# Patient Record
Sex: Female | Born: 1985 | Race: White | Hispanic: No | Marital: Married | Smoking: Never smoker
Health system: Southern US, Community
[De-identification: ages and names within clinical notes are randomized; demographics above are authoritative.]

## PROBLEM LIST (undated history)

## (undated) ENCOUNTER — Inpatient Hospital Stay (HOSPITAL_COMMUNITY): Payer: Self-pay

## (undated) DIAGNOSIS — B009 Herpesviral infection, unspecified: Secondary | ICD-10-CM

## (undated) DIAGNOSIS — H3523 Other non-diabetic proliferative retinopathy, bilateral: Secondary | ICD-10-CM

## (undated) DIAGNOSIS — R87619 Unspecified abnormal cytological findings in specimens from cervix uteri: Secondary | ICD-10-CM

## (undated) HISTORY — DX: Unspecified abnormal cytological findings in specimens from cervix uteri: R87.619

## (undated) HISTORY — DX: Herpesviral infection, unspecified: B00.9

## (undated) HISTORY — PX: LYMPH NODE BIOPSY: SHX201

---
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
## 2010-09-18 ENCOUNTER — Ambulatory Visit (HOSPITAL_COMMUNITY): Admission: RE | Admit: 2010-09-18 | Discharge: 2010-09-18 | Payer: Self-pay | Admitting: Obstetrics & Gynecology

## 2010-09-18 ENCOUNTER — Ambulatory Visit: Payer: Self-pay | Admitting: Obstetrics & Gynecology

## 2010-10-07 ENCOUNTER — Ambulatory Visit: Payer: Self-pay | Admitting: Family Medicine

## 2010-10-21 ENCOUNTER — Encounter: Payer: Self-pay | Admitting: Advanced Practice Midwife

## 2010-10-21 ENCOUNTER — Ambulatory Visit: Payer: Self-pay | Admitting: Obstetrics and Gynecology

## 2010-10-21 LAB — CONVERTED CEMR LAB
ALT: 9 units/L (ref 0–35)
AST: 13 units/L (ref 0–37)
Albumin: 4.1 g/dL (ref 3.5–5.2)
Alkaline Phosphatase: 38 units/L — ABNORMAL LOW (ref 39–117)
BUN: 8 mg/dL (ref 6–23)
CO2: 25 meq/L (ref 19–32)
Calcium: 9.2 mg/dL (ref 8.4–10.5)
Chloride: 105 meq/L (ref 96–112)
Creatinine, Ser: 0.6 mg/dL (ref 0.40–1.20)
Glucose, Bld: 162 mg/dL — ABNORMAL HIGH (ref 70–99)
HCT: 36.3 % (ref 36.0–46.0)
Hemoglobin: 12.2 g/dL (ref 12.0–15.0)
MCHC: 33.6 g/dL (ref 30.0–36.0)
MCV: 90.8 fL (ref 78.0–100.0)
Platelets: 182 10*3/uL (ref 150–400)
Potassium: 4 meq/L (ref 3.5–5.3)
RBC: 4 M/uL (ref 3.87–5.11)
RDW: 13 % (ref 11.5–15.5)
Sodium: 138 meq/L (ref 135–145)
TSH: 1.878 microintl units/mL (ref 0.350–4.500)
Total Bilirubin: 0.4 mg/dL (ref 0.3–1.2)
Total Protein: 6.4 g/dL (ref 6.0–8.3)
Uric Acid, Serum: 2.6 mg/dL (ref 2.4–7.0)
WBC: 7.8 10*3/uL (ref 4.0–10.5)

## 2010-10-22 ENCOUNTER — Encounter (INDEPENDENT_AMBULATORY_CARE_PROVIDER_SITE_OTHER): Payer: Self-pay | Admitting: *Deleted

## 2010-10-22 LAB — CONVERTED CEMR LAB
Collection Interval-CRCL: 24 hr
Creatinine 24 HR UR: 1339 mg/24hr (ref 700–1800)
Creatinine Clearance: 155 mL/min — ABNORMAL HIGH (ref 75–115)
Creatinine, Urine: 74.4 mg/dL
Protein, Ur: 54 mg/24hr (ref 50–100)

## 2010-11-04 ENCOUNTER — Encounter
Admission: RE | Admit: 2010-11-04 | Discharge: 2011-01-07 | Payer: Self-pay | Source: Home / Self Care | Attending: Obstetrics & Gynecology | Admitting: Obstetrics & Gynecology

## 2010-11-04 ENCOUNTER — Ambulatory Visit: Payer: Self-pay | Admitting: Obstetrics & Gynecology

## 2010-11-18 ENCOUNTER — Ambulatory Visit: Payer: Self-pay | Admitting: Obstetrics & Gynecology

## 2010-11-25 ENCOUNTER — Ambulatory Visit (HOSPITAL_COMMUNITY)
Admission: RE | Admit: 2010-11-25 | Discharge: 2010-11-25 | Payer: Self-pay | Source: Home / Self Care | Attending: Obstetrics and Gynecology | Admitting: Obstetrics and Gynecology

## 2010-12-09 ENCOUNTER — Ambulatory Visit: Payer: Self-pay | Admitting: Obstetrics & Gynecology

## 2010-12-10 ENCOUNTER — Encounter: Payer: Self-pay | Admitting: Obstetrics & Gynecology

## 2010-12-23 ENCOUNTER — Ambulatory Visit
Admission: RE | Admit: 2010-12-23 | Discharge: 2010-12-23 | Payer: Self-pay | Source: Home / Self Care | Attending: Obstetrics and Gynecology | Admitting: Obstetrics and Gynecology

## 2010-12-25 LAB — POCT URINALYSIS DIPSTICK
Bilirubin Urine: NEGATIVE
Hgb urine dipstick: NEGATIVE
Ketones, ur: NEGATIVE mg/dL
Nitrite: NEGATIVE
Protein, ur: 30 mg/dL — AB
Specific Gravity, Urine: 1.02 (ref 1.005–1.030)
Urine Glucose, Fasting: NEGATIVE mg/dL
Urobilinogen, UA: 0.2 mg/dL (ref 0.0–1.0)
pH: 7 (ref 5.0–8.0)

## 2010-12-28 ENCOUNTER — Encounter: Payer: Self-pay | Admitting: Obstetrics & Gynecology

## 2011-01-06 ENCOUNTER — Ambulatory Visit (HOSPITAL_COMMUNITY)
Admission: RE | Admit: 2011-01-06 | Discharge: 2011-01-06 | Payer: Self-pay | Source: Home / Self Care | Attending: Family Medicine | Admitting: Family Medicine

## 2011-01-06 ENCOUNTER — Other Ambulatory Visit: Payer: Self-pay | Admitting: Family Medicine

## 2011-01-06 ENCOUNTER — Ambulatory Visit
Admission: RE | Admit: 2011-01-06 | Discharge: 2011-01-06 | Payer: Self-pay | Source: Home / Self Care | Attending: Obstetrics & Gynecology | Admitting: Obstetrics & Gynecology

## 2011-01-06 DIAGNOSIS — O24419 Gestational diabetes mellitus in pregnancy, unspecified control: Secondary | ICD-10-CM

## 2011-01-06 LAB — POCT URINALYSIS DIPSTICK
Bilirubin Urine: NEGATIVE
Hgb urine dipstick: NEGATIVE
Ketones, ur: NEGATIVE mg/dL
Protein, ur: NEGATIVE mg/dL
Specific Gravity, Urine: 1.02 (ref 1.005–1.030)
Urine Glucose, Fasting: NEGATIVE mg/dL
Urobilinogen, UA: 0.2 mg/dL (ref 0.0–1.0)
pH: 7 (ref 5.0–8.0)

## 2011-01-20 ENCOUNTER — Other Ambulatory Visit: Payer: Self-pay

## 2011-01-20 DIAGNOSIS — O24919 Unspecified diabetes mellitus in pregnancy, unspecified trimester: Secondary | ICD-10-CM

## 2011-01-20 LAB — POCT URINALYSIS DIPSTICK
Bilirubin Urine: NEGATIVE
Hgb urine dipstick: NEGATIVE
Ketones, ur: NEGATIVE mg/dL
Nitrite: NEGATIVE
Protein, ur: NEGATIVE mg/dL
Specific Gravity, Urine: 1.015 (ref 1.005–1.030)
Urine Glucose, Fasting: 500 mg/dL — AB
Urobilinogen, UA: 0.2 mg/dL (ref 0.0–1.0)
pH: 6 (ref 5.0–8.0)

## 2011-01-27 ENCOUNTER — Ambulatory Visit (HOSPITAL_COMMUNITY)
Admission: RE | Admit: 2011-01-27 | Discharge: 2011-01-27 | Disposition: A | Discharge status: Home / Self Care | Payer: PRIVATE HEALTH INSURANCE | Source: Ambulatory Visit | Attending: Family Medicine | Admitting: Family Medicine

## 2011-01-27 DIAGNOSIS — O24919 Unspecified diabetes mellitus in pregnancy, unspecified trimester: Secondary | ICD-10-CM | POA: Insufficient documentation

## 2011-01-27 DIAGNOSIS — O24419 Gestational diabetes mellitus in pregnancy, unspecified control: Secondary | ICD-10-CM

## 2011-01-27 DIAGNOSIS — Z3689 Encounter for other specified antenatal screening: Secondary | ICD-10-CM | POA: Insufficient documentation

## 2011-02-03 ENCOUNTER — Other Ambulatory Visit: Payer: Self-pay

## 2011-02-03 ENCOUNTER — Encounter: Payer: PRIVATE HEALTH INSURANCE | Attending: Obstetrics & Gynecology | Admitting: Dietician

## 2011-02-03 ENCOUNTER — Other Ambulatory Visit: Payer: Self-pay | Admitting: Obstetrics & Gynecology

## 2011-02-03 ENCOUNTER — Encounter (INDEPENDENT_AMBULATORY_CARE_PROVIDER_SITE_OTHER): Payer: Self-pay | Admitting: *Deleted

## 2011-02-03 DIAGNOSIS — O24919 Unspecified diabetes mellitus in pregnancy, unspecified trimester: Secondary | ICD-10-CM

## 2011-02-03 DIAGNOSIS — E119 Type 2 diabetes mellitus without complications: Secondary | ICD-10-CM | POA: Insufficient documentation

## 2011-02-03 DIAGNOSIS — Z713 Dietary counseling and surveillance: Secondary | ICD-10-CM | POA: Insufficient documentation

## 2011-02-03 LAB — POCT URINALYSIS DIPSTICK
Bilirubin Urine: NEGATIVE
Hgb urine dipstick: NEGATIVE
Ketones, ur: NEGATIVE mg/dL
Nitrite: NEGATIVE
Protein, ur: NEGATIVE mg/dL
Specific Gravity, Urine: 1.02 (ref 1.005–1.030)
Urine Glucose, Fasting: 250 mg/dL — AB
Urobilinogen, UA: 0.2 mg/dL (ref 0.0–1.0)
pH: 8.5 — ABNORMAL HIGH (ref 5.0–8.0)

## 2011-02-03 LAB — CONVERTED CEMR LAB

## 2011-02-04 ENCOUNTER — Encounter: Payer: Self-pay | Admitting: Advanced Practice Midwife

## 2011-02-04 ENCOUNTER — Encounter (INDEPENDENT_AMBULATORY_CARE_PROVIDER_SITE_OTHER): Payer: Self-pay | Admitting: *Deleted

## 2011-02-04 LAB — CONVERTED CEMR LAB
Collection Interval-CRCL: 24 hr
Creatinine 24 HR UR: 1317 mg/24hr (ref 700–1800)
Creatinine Clearance: 176 mL/min — ABNORMAL HIGH (ref 75–115)
Creatinine, Urine: 50.7 mg/dL
Fructosamine: 213 micromoles/L (ref <285)
Protein, 24H Urine: 78 mg/24hr (ref 50–100)

## 2011-02-05 LAB — CONVERTED CEMR LAB: Pap Smear: UNDETERMINED

## 2011-02-10 ENCOUNTER — Other Ambulatory Visit: Payer: Self-pay | Admitting: Obstetrics & Gynecology

## 2011-02-10 ENCOUNTER — Other Ambulatory Visit: Payer: Self-pay

## 2011-02-10 DIAGNOSIS — R87610 Atypical squamous cells of undetermined significance on cytologic smear of cervix (ASC-US): Secondary | ICD-10-CM

## 2011-02-10 DIAGNOSIS — O24919 Unspecified diabetes mellitus in pregnancy, unspecified trimester: Secondary | ICD-10-CM

## 2011-02-10 LAB — POCT URINALYSIS DIPSTICK
Bilirubin Urine: NEGATIVE
Glucose, UA: 100 mg/dL — AB
Hgb urine dipstick: NEGATIVE
Ketones, ur: NEGATIVE mg/dL
Nitrite: NEGATIVE
Protein, ur: NEGATIVE mg/dL
Specific Gravity, Urine: 1.02 (ref 1.005–1.030)
Urobilinogen, UA: 0.2 mg/dL (ref 0.0–1.0)
pH: 7 (ref 5.0–8.0)

## 2011-02-12 ENCOUNTER — Encounter (HOSPITAL_COMMUNITY): Payer: Self-pay

## 2011-02-12 ENCOUNTER — Ambulatory Visit (HOSPITAL_COMMUNITY)
Admission: RE | Admit: 2011-02-12 | Discharge: 2011-02-12 | Disposition: A | Discharge status: Home / Self Care | Payer: PRIVATE HEALTH INSURANCE | Source: Ambulatory Visit | Attending: Obstetrics & Gynecology | Admitting: Obstetrics & Gynecology

## 2011-02-12 DIAGNOSIS — Z3689 Encounter for other specified antenatal screening: Secondary | ICD-10-CM | POA: Insufficient documentation

## 2011-02-12 DIAGNOSIS — O24919 Unspecified diabetes mellitus in pregnancy, unspecified trimester: Secondary | ICD-10-CM | POA: Insufficient documentation

## 2011-02-17 ENCOUNTER — Other Ambulatory Visit: Payer: Self-pay

## 2011-02-17 DIAGNOSIS — O24919 Unspecified diabetes mellitus in pregnancy, unspecified trimester: Secondary | ICD-10-CM

## 2011-02-17 LAB — POCT URINALYSIS DIPSTICK
Bilirubin Urine: NEGATIVE
Bilirubin Urine: NEGATIVE
Glucose, UA: NEGATIVE mg/dL
Glucose, UA: NEGATIVE mg/dL
Hgb urine dipstick: NEGATIVE
Hgb urine dipstick: NEGATIVE
Ketones, ur: NEGATIVE mg/dL
Ketones, ur: NEGATIVE mg/dL
Nitrite: NEGATIVE
Nitrite: NEGATIVE
Protein, ur: NEGATIVE mg/dL
Protein, ur: NEGATIVE mg/dL
Specific Gravity, Urine: 1.025 (ref 1.005–1.030)
Specific Gravity, Urine: 1.015 (ref 1.005–1.030)
Urobilinogen, UA: 0.2 mg/dL (ref 0.0–1.0)
Urobilinogen, UA: 0.2 mg/dL (ref 0.0–1.0)
pH: 6.5 (ref 5.0–8.0)
pH: 8.5 — ABNORMAL HIGH (ref 5.0–8.0)

## 2011-02-18 LAB — POCT URINALYSIS DIPSTICK
Bilirubin Urine: NEGATIVE
Bilirubin Urine: NEGATIVE
Bilirubin Urine: NEGATIVE
Glucose, UA: NEGATIVE mg/dL
Glucose, UA: 100 mg/dL — AB
Glucose, UA: NEGATIVE mg/dL
Hgb urine dipstick: NEGATIVE
Hgb urine dipstick: NEGATIVE
Hgb urine dipstick: NEGATIVE
Ketones, ur: NEGATIVE mg/dL
Ketones, ur: NEGATIVE mg/dL
Ketones, ur: NEGATIVE mg/dL
Nitrite: NEGATIVE
Nitrite: NEGATIVE
Nitrite: NEGATIVE
Protein, ur: NEGATIVE mg/dL
Protein, ur: NEGATIVE mg/dL
Protein, ur: NEGATIVE mg/dL
Specific Gravity, Urine: 1.02 (ref 1.005–1.030)
Specific Gravity, Urine: 1.025 (ref 1.005–1.030)
Specific Gravity, Urine: >=1.03 (ref 1.005–1.030)
Urobilinogen, UA: 0.2 mg/dL (ref 0.0–1.0)
Urobilinogen, UA: 0.2 mg/dL (ref 0.0–1.0)
Urobilinogen, UA: 0.2 mg/dL (ref 0.0–1.0)
pH: 7 (ref 5.0–8.0)
pH: 5.5 (ref 5.0–8.0)
pH: 6 (ref 5.0–8.0)

## 2011-02-19 ENCOUNTER — Encounter (HOSPITAL_COMMUNITY): Payer: Self-pay

## 2011-02-19 ENCOUNTER — Ambulatory Visit (HOSPITAL_COMMUNITY)
Admission: RE | Admit: 2011-02-19 | Discharge: 2011-02-19 | Disposition: A | Discharge status: Home / Self Care | Payer: PRIVATE HEALTH INSURANCE | Source: Ambulatory Visit | Attending: Obstetrics & Gynecology | Admitting: Obstetrics & Gynecology

## 2011-02-19 DIAGNOSIS — Z3689 Encounter for other specified antenatal screening: Secondary | ICD-10-CM | POA: Insufficient documentation

## 2011-02-19 DIAGNOSIS — O24919 Unspecified diabetes mellitus in pregnancy, unspecified trimester: Secondary | ICD-10-CM | POA: Insufficient documentation

## 2011-02-19 LAB — POCT URINALYSIS DIPSTICK
Bilirubin Urine: NEGATIVE
Glucose, UA: NEGATIVE mg/dL
Hgb urine dipstick: NEGATIVE
Ketones, ur: NEGATIVE mg/dL
Nitrite: NEGATIVE
Protein, ur: NEGATIVE mg/dL
Specific Gravity, Urine: 1.02 (ref 1.005–1.030)
Urobilinogen, UA: 0.2 mg/dL (ref 0.0–1.0)
pH: 8.5 — ABNORMAL HIGH (ref 5.0–8.0)

## 2011-02-20 LAB — POCT PREGNANCY, URINE: Preg Test, Ur: POSITIVE

## 2011-02-24 ENCOUNTER — Other Ambulatory Visit: Payer: Self-pay

## 2011-02-24 ENCOUNTER — Ambulatory Visit (HOSPITAL_COMMUNITY)
Admission: RE | Admit: 2011-02-24 | Discharge: 2011-02-24 | Disposition: A | Discharge status: Home / Self Care | Payer: PRIVATE HEALTH INSURANCE | Source: Ambulatory Visit | Attending: Obstetrics & Gynecology | Admitting: Obstetrics & Gynecology

## 2011-02-24 ENCOUNTER — Other Ambulatory Visit: Payer: Self-pay | Admitting: Obstetrics & Gynecology

## 2011-02-24 DIAGNOSIS — Z3689 Encounter for other specified antenatal screening: Secondary | ICD-10-CM | POA: Insufficient documentation

## 2011-02-24 DIAGNOSIS — O24919 Unspecified diabetes mellitus in pregnancy, unspecified trimester: Secondary | ICD-10-CM

## 2011-02-24 DIAGNOSIS — R87610 Atypical squamous cells of undetermined significance on cytologic smear of cervix (ASC-US): Secondary | ICD-10-CM

## 2011-02-24 LAB — POCT URINALYSIS DIP (DEVICE)
Bilirubin Urine: NEGATIVE
Glucose, UA: NEGATIVE mg/dL
Hgb urine dipstick: NEGATIVE
Ketones, ur: NEGATIVE mg/dL
Nitrite: NEGATIVE
Protein, ur: NEGATIVE mg/dL
Specific Gravity, Urine: 1.015 (ref 1.005–1.030)
Urobilinogen, UA: 0.2 mg/dL (ref 0.0–1.0)
pH: 8.5 — ABNORMAL HIGH (ref 5.0–8.0)

## 2011-03-03 ENCOUNTER — Encounter: Payer: PRIVATE HEALTH INSURANCE | Attending: Obstetrics and Gynecology | Admitting: Dietician

## 2011-03-03 ENCOUNTER — Other Ambulatory Visit: Payer: Self-pay | Admitting: Obstetrics and Gynecology

## 2011-03-03 DIAGNOSIS — R87610 Atypical squamous cells of undetermined significance on cytologic smear of cervix (ASC-US): Secondary | ICD-10-CM

## 2011-03-03 DIAGNOSIS — O99810 Abnormal glucose complicating pregnancy: Secondary | ICD-10-CM | POA: Insufficient documentation

## 2011-03-03 DIAGNOSIS — O24919 Unspecified diabetes mellitus in pregnancy, unspecified trimester: Secondary | ICD-10-CM

## 2011-03-03 DIAGNOSIS — Z713 Dietary counseling and surveillance: Secondary | ICD-10-CM | POA: Insufficient documentation

## 2011-03-03 DIAGNOSIS — Z331 Pregnant state, incidental: Secondary | ICD-10-CM

## 2011-03-03 LAB — POCT URINALYSIS DIP (DEVICE)
Bilirubin Urine: NEGATIVE
Glucose, UA: NEGATIVE mg/dL
Hgb urine dipstick: NEGATIVE
Ketones, ur: NEGATIVE mg/dL
Nitrite: NEGATIVE
Protein, ur: NEGATIVE mg/dL
Specific Gravity, Urine: 1.015 (ref 1.005–1.030)
Urobilinogen, UA: 0.2 mg/dL (ref 0.0–1.0)
pH: 7 (ref 5.0–8.0)

## 2011-03-06 ENCOUNTER — Other Ambulatory Visit: Payer: PRIVATE HEALTH INSURANCE

## 2011-03-06 DIAGNOSIS — O99810 Abnormal glucose complicating pregnancy: Secondary | ICD-10-CM

## 2011-03-10 ENCOUNTER — Other Ambulatory Visit: Payer: PRIVATE HEALTH INSURANCE

## 2011-03-10 ENCOUNTER — Other Ambulatory Visit: Payer: Self-pay | Admitting: Obstetrics & Gynecology

## 2011-03-10 DIAGNOSIS — O24919 Unspecified diabetes mellitus in pregnancy, unspecified trimester: Secondary | ICD-10-CM

## 2011-03-10 DIAGNOSIS — Z331 Pregnant state, incidental: Secondary | ICD-10-CM

## 2011-03-10 DIAGNOSIS — R87610 Atypical squamous cells of undetermined significance on cytologic smear of cervix (ASC-US): Secondary | ICD-10-CM

## 2011-03-10 LAB — POCT URINALYSIS DIP (DEVICE)
Bilirubin Urine: NEGATIVE
Glucose, UA: 100 mg/dL — AB
Hgb urine dipstick: NEGATIVE
Ketones, ur: NEGATIVE mg/dL
Nitrite: NEGATIVE
Protein, ur: NEGATIVE mg/dL
Specific Gravity, Urine: 1.025 (ref 1.005–1.030)
Urobilinogen, UA: 0.2 mg/dL (ref 0.0–1.0)
pH: 6.5 (ref 5.0–8.0)

## 2011-03-13 ENCOUNTER — Other Ambulatory Visit: Payer: PRIVATE HEALTH INSURANCE

## 2011-03-13 DIAGNOSIS — O24919 Unspecified diabetes mellitus in pregnancy, unspecified trimester: Secondary | ICD-10-CM

## 2011-03-13 DIAGNOSIS — Z331 Pregnant state, incidental: Secondary | ICD-10-CM

## 2011-03-17 ENCOUNTER — Other Ambulatory Visit: Payer: Self-pay | Admitting: Obstetrics & Gynecology

## 2011-03-17 ENCOUNTER — Other Ambulatory Visit: Payer: PRIVATE HEALTH INSURANCE

## 2011-03-17 DIAGNOSIS — O24919 Unspecified diabetes mellitus in pregnancy, unspecified trimester: Secondary | ICD-10-CM

## 2011-03-17 DIAGNOSIS — Z331 Pregnant state, incidental: Secondary | ICD-10-CM

## 2011-03-17 LAB — POCT URINALYSIS DIP (DEVICE)
Bilirubin Urine: NEGATIVE
Glucose, UA: 500 mg/dL — AB
Hgb urine dipstick: NEGATIVE
Ketones, ur: NEGATIVE mg/dL
Nitrite: NEGATIVE
Protein, ur: NEGATIVE mg/dL
Specific Gravity, Urine: 1.02 (ref 1.005–1.030)
Urobilinogen, UA: 0.2 mg/dL (ref 0.0–1.0)
pH: 6 (ref 5.0–8.0)

## 2011-03-21 ENCOUNTER — Other Ambulatory Visit: Payer: PRIVATE HEALTH INSURANCE

## 2011-03-21 DIAGNOSIS — O24919 Unspecified diabetes mellitus in pregnancy, unspecified trimester: Secondary | ICD-10-CM

## 2011-03-24 ENCOUNTER — Other Ambulatory Visit: Payer: Self-pay | Admitting: Family Medicine

## 2011-03-24 ENCOUNTER — Other Ambulatory Visit: Payer: Self-pay | Admitting: Obstetrics and Gynecology

## 2011-03-24 ENCOUNTER — Other Ambulatory Visit: Payer: PRIVATE HEALTH INSURANCE

## 2011-03-24 DIAGNOSIS — O24919 Unspecified diabetes mellitus in pregnancy, unspecified trimester: Secondary | ICD-10-CM

## 2011-03-24 DIAGNOSIS — Z331 Pregnant state, incidental: Secondary | ICD-10-CM

## 2011-03-24 LAB — POCT URINALYSIS DIP (DEVICE)
Bilirubin Urine: NEGATIVE
Glucose, UA: NEGATIVE mg/dL
Hgb urine dipstick: NEGATIVE
Protein, ur: NEGATIVE mg/dL

## 2011-03-25 ENCOUNTER — Ambulatory Visit (HOSPITAL_COMMUNITY): Payer: PRIVATE HEALTH INSURANCE

## 2011-03-28 ENCOUNTER — Ambulatory Visit (HOSPITAL_COMMUNITY)
Admission: RE | Admit: 2011-03-28 | Discharge: 2011-03-28 | Disposition: A | Discharge status: Home / Self Care | Payer: PRIVATE HEALTH INSURANCE | Source: Ambulatory Visit | Attending: Family Medicine | Admitting: Family Medicine

## 2011-03-28 ENCOUNTER — Ambulatory Visit (HOSPITAL_COMMUNITY)
Admission: RE | Admit: 2011-03-28 | Discharge: 2011-03-28 | Disposition: A | Discharge status: Home / Self Care | Payer: PRIVATE HEALTH INSURANCE | Source: Ambulatory Visit | Attending: Obstetrics & Gynecology | Admitting: Obstetrics & Gynecology

## 2011-03-28 DIAGNOSIS — O24919 Unspecified diabetes mellitus in pregnancy, unspecified trimester: Secondary | ICD-10-CM | POA: Insufficient documentation

## 2011-03-28 DIAGNOSIS — Z3689 Encounter for other specified antenatal screening: Secondary | ICD-10-CM | POA: Insufficient documentation

## 2011-03-31 ENCOUNTER — Other Ambulatory Visit: Payer: Self-pay | Admitting: Family Medicine

## 2011-03-31 ENCOUNTER — Other Ambulatory Visit: Payer: PRIVATE HEALTH INSURANCE

## 2011-03-31 DIAGNOSIS — Z331 Pregnant state, incidental: Secondary | ICD-10-CM

## 2011-03-31 DIAGNOSIS — O24919 Unspecified diabetes mellitus in pregnancy, unspecified trimester: Secondary | ICD-10-CM

## 2011-03-31 LAB — POCT URINALYSIS DIP (DEVICE)
Hgb urine dipstick: NEGATIVE
Protein, ur: NEGATIVE mg/dL
Specific Gravity, Urine: 1.025 (ref 1.005–1.030)
Urobilinogen, UA: 0.2 mg/dL (ref 0.0–1.0)

## 2011-04-04 ENCOUNTER — Other Ambulatory Visit: Payer: PRIVATE HEALTH INSURANCE

## 2011-04-04 DIAGNOSIS — O24919 Unspecified diabetes mellitus in pregnancy, unspecified trimester: Secondary | ICD-10-CM

## 2011-04-04 DIAGNOSIS — Z331 Pregnant state, incidental: Secondary | ICD-10-CM

## 2011-04-07 ENCOUNTER — Other Ambulatory Visit: Payer: PRIVATE HEALTH INSURANCE

## 2011-04-07 ENCOUNTER — Other Ambulatory Visit: Payer: Self-pay | Admitting: Obstetrics & Gynecology

## 2011-04-07 DIAGNOSIS — Z331 Pregnant state, incidental: Secondary | ICD-10-CM

## 2011-04-07 DIAGNOSIS — O169 Unspecified maternal hypertension, unspecified trimester: Secondary | ICD-10-CM

## 2011-04-07 DIAGNOSIS — O24919 Unspecified diabetes mellitus in pregnancy, unspecified trimester: Secondary | ICD-10-CM

## 2011-04-07 LAB — POCT URINALYSIS DIP (DEVICE)
Bilirubin Urine: NEGATIVE
Hgb urine dipstick: NEGATIVE
Nitrite: NEGATIVE
pH: 7 (ref 5.0–8.0)

## 2011-04-10 ENCOUNTER — Other Ambulatory Visit: Payer: PRIVATE HEALTH INSURANCE

## 2011-04-10 DIAGNOSIS — Z331 Pregnant state, incidental: Secondary | ICD-10-CM

## 2011-04-10 DIAGNOSIS — O24919 Unspecified diabetes mellitus in pregnancy, unspecified trimester: Secondary | ICD-10-CM

## 2011-04-14 ENCOUNTER — Other Ambulatory Visit: Payer: Self-pay | Admitting: Obstetrics & Gynecology

## 2011-04-14 ENCOUNTER — Other Ambulatory Visit: Payer: PRIVATE HEALTH INSURANCE

## 2011-04-14 DIAGNOSIS — Z331 Pregnant state, incidental: Secondary | ICD-10-CM

## 2011-04-14 DIAGNOSIS — O24919 Unspecified diabetes mellitus in pregnancy, unspecified trimester: Secondary | ICD-10-CM

## 2011-04-14 LAB — POCT URINALYSIS DIP (DEVICE)
Hgb urine dipstick: NEGATIVE
Ketones, ur: NEGATIVE mg/dL
Protein, ur: NEGATIVE mg/dL
pH: 7 (ref 5.0–8.0)

## 2011-04-15 ENCOUNTER — Ambulatory Visit (HOSPITAL_COMMUNITY): Payer: PRIVATE HEALTH INSURANCE

## 2011-04-18 ENCOUNTER — Ambulatory Visit (HOSPITAL_COMMUNITY)
Admission: RE | Admit: 2011-04-18 | Discharge: 2011-04-18 | Disposition: A | Discharge status: Home / Self Care | Payer: PRIVATE HEALTH INSURANCE | Source: Ambulatory Visit | Attending: Family Medicine | Admitting: Family Medicine

## 2011-04-18 ENCOUNTER — Other Ambulatory Visit: Payer: Self-pay | Admitting: Family Medicine

## 2011-04-18 ENCOUNTER — Ambulatory Visit (HOSPITAL_COMMUNITY): Admission: RE | Admit: 2011-04-18 | Payer: PRIVATE HEALTH INSURANCE | Source: Ambulatory Visit

## 2011-04-18 DIAGNOSIS — Z3689 Encounter for other specified antenatal screening: Secondary | ICD-10-CM | POA: Insufficient documentation

## 2011-04-18 DIAGNOSIS — O24919 Unspecified diabetes mellitus in pregnancy, unspecified trimester: Secondary | ICD-10-CM

## 2011-04-21 ENCOUNTER — Other Ambulatory Visit: Payer: PRIVATE HEALTH INSURANCE

## 2011-04-21 ENCOUNTER — Other Ambulatory Visit: Payer: Self-pay | Admitting: Obstetrics & Gynecology

## 2011-04-21 DIAGNOSIS — Z331 Pregnant state, incidental: Secondary | ICD-10-CM

## 2011-04-21 DIAGNOSIS — O24919 Unspecified diabetes mellitus in pregnancy, unspecified trimester: Secondary | ICD-10-CM

## 2011-04-21 LAB — POCT URINALYSIS DIP (DEVICE)
Bilirubin Urine: NEGATIVE
Glucose, UA: NEGATIVE mg/dL
Nitrite: NEGATIVE
Urobilinogen, UA: 0.2 mg/dL (ref 0.0–1.0)

## 2011-04-22 ENCOUNTER — Inpatient Hospital Stay (HOSPITAL_COMMUNITY)
Admission: AD | Admit: 2011-04-22 | Discharge: 2011-04-26 | DRG: 765 | Disposition: A | Discharge status: Home / Self Care | Payer: PRIVATE HEALTH INSURANCE | Source: Ambulatory Visit | Attending: Obstetrics & Gynecology | Admitting: Obstetrics & Gynecology

## 2011-04-22 ENCOUNTER — Encounter (HOSPITAL_COMMUNITY): Payer: Self-pay | Admitting: *Deleted

## 2011-04-22 DIAGNOSIS — O324XX0 Maternal care for high head at term, not applicable or unspecified: Secondary | ICD-10-CM | POA: Diagnosis present

## 2011-04-22 DIAGNOSIS — O411090 Infection of amniotic sac and membranes, unspecified, unspecified trimester, not applicable or unspecified: Secondary | ICD-10-CM | POA: Diagnosis present

## 2011-04-22 DIAGNOSIS — D649 Anemia, unspecified: Secondary | ICD-10-CM | POA: Diagnosis not present

## 2011-04-22 DIAGNOSIS — O99814 Abnormal glucose complicating childbirth: Principal | ICD-10-CM | POA: Diagnosis present

## 2011-04-22 DIAGNOSIS — O3660X0 Maternal care for excessive fetal growth, unspecified trimester, not applicable or unspecified: Secondary | ICD-10-CM | POA: Diagnosis present

## 2011-04-22 DIAGNOSIS — O9903 Anemia complicating the puerperium: Secondary | ICD-10-CM | POA: Diagnosis not present

## 2011-04-22 LAB — GLUCOSE, CAPILLARY: Glucose-Capillary: 85 mg/dL (ref 70–99)

## 2011-04-22 LAB — CBC
Hemoglobin: 12 g/dL (ref 12.0–15.0)
Platelets: 140 10*3/uL — ABNORMAL LOW (ref 150–400)
RBC: 3.89 MIL/uL (ref 3.87–5.11)

## 2011-04-23 LAB — COMPREHENSIVE METABOLIC PANEL
AST: 25 U/L (ref 0–37)
Albumin: 2.3 g/dL — ABNORMAL LOW (ref 3.5–5.2)
BUN: 8 mg/dL (ref 6–23)
Chloride: 102 mEq/L (ref 96–112)
Creatinine, Ser: 0.68 mg/dL (ref 0.4–1.2)
GFR calc Af Amer: >60 mL/min (ref >60)
Potassium: 3.5 mEq/L (ref 3.5–5.1)
Total Bilirubin: 0.2 mg/dL — ABNORMAL LOW (ref 0.3–1.2)
Total Protein: 5.5 g/dL — ABNORMAL LOW (ref 6.0–8.3)

## 2011-04-23 LAB — CBC
MCH: 30.4 pg (ref 26.0–34.0)
MCV: 88.7 fL (ref 78.0–100.0)
Platelets: 150 10*3/uL (ref 150–400)
RBC: 3.81 MIL/uL — ABNORMAL LOW (ref 3.87–5.11)
RDW: 13.3 % (ref 11.5–15.5)
WBC: 9.4 10*3/uL (ref 4.0–10.5)

## 2011-04-23 LAB — PROTEIN / CREATININE RATIO, URINE
Protein Creatinine Ratio: 0.66 — ABNORMAL HIGH (ref 0.00–0.15)
Total Protein, Urine: 79.6 mg/dL

## 2011-04-23 LAB — RPR: RPR Ser Ql: NONREACTIVE

## 2011-04-24 ENCOUNTER — Other Ambulatory Visit: Payer: Self-pay | Admitting: Obstetrics & Gynecology

## 2011-04-24 DIAGNOSIS — O324XX0 Maternal care for high head at term, not applicable or unspecified: Secondary | ICD-10-CM

## 2011-04-24 DIAGNOSIS — O99814 Abnormal glucose complicating childbirth: Secondary | ICD-10-CM

## 2011-04-24 DIAGNOSIS — O411090 Infection of amniotic sac and membranes, unspecified, unspecified trimester, not applicable or unspecified: Secondary | ICD-10-CM

## 2011-04-24 LAB — GLUCOSE, CAPILLARY
Glucose-Capillary: 107 mg/dL — ABNORMAL HIGH (ref 70–99)
Glucose-Capillary: 110 mg/dL — ABNORMAL HIGH (ref 70–99)
Glucose-Capillary: 112 mg/dL — ABNORMAL HIGH (ref 70–99)
Glucose-Capillary: 114 mg/dL — ABNORMAL HIGH (ref 70–99)
Glucose-Capillary: 114 mg/dL — ABNORMAL HIGH (ref 70–99)
Glucose-Capillary: 122 mg/dL — ABNORMAL HIGH (ref 70–99)
Glucose-Capillary: 145 mg/dL — ABNORMAL HIGH (ref 70–99)
Glucose-Capillary: 75 mg/dL (ref 70–99)
Glucose-Capillary: 90 mg/dL (ref 70–99)
Glucose-Capillary: 101 mg/dL — ABNORMAL HIGH (ref 70–99)
Glucose-Capillary: 108 mg/dL — ABNORMAL HIGH (ref 70–99)
Glucose-Capillary: 115 mg/dL — ABNORMAL HIGH (ref 70–99)
Glucose-Capillary: 153 mg/dL — ABNORMAL HIGH (ref 70–99)
Glucose-Capillary: 153 mg/dL — ABNORMAL HIGH (ref 70–99)
Glucose-Capillary: 166 mg/dL — ABNORMAL HIGH (ref 70–99)
Glucose-Capillary: 232 mg/dL — ABNORMAL HIGH (ref 70–99)

## 2011-04-24 LAB — ABO/RH: ABO/RH(D): O POS

## 2011-04-25 LAB — CBC
Hemoglobin: 7.5 g/dL — ABNORMAL LOW (ref 12.0–15.0)
MCH: 30.6 pg (ref 26.0–34.0)
MCV: 87.3 fL (ref 78.0–100.0)
RBC: 2.45 MIL/uL — ABNORMAL LOW (ref 3.87–5.11)

## 2011-04-25 LAB — GLUCOSE, CAPILLARY
Glucose-Capillary: 152 mg/dL — ABNORMAL HIGH (ref 70–99)
Glucose-Capillary: 144 mg/dL — ABNORMAL HIGH (ref 70–99)
Glucose-Capillary: 159 mg/dL — ABNORMAL HIGH (ref 70–99)
Glucose-Capillary: 192 mg/dL — ABNORMAL HIGH (ref 70–99)
Glucose-Capillary: 204 mg/dL — ABNORMAL HIGH (ref 70–99)
Glucose-Capillary: 175 mg/dL — ABNORMAL HIGH (ref 70–99)
Glucose-Capillary: 143 mg/dL — ABNORMAL HIGH (ref 70–99)

## 2011-04-26 LAB — GLUCOSE, CAPILLARY
Glucose-Capillary: 189 mg/dL — ABNORMAL HIGH (ref 70–99)
Glucose-Capillary: 98 mg/dL (ref 70–99)
Glucose-Capillary: 139 mg/dL — ABNORMAL HIGH (ref 70–99)

## 2011-04-26 LAB — CBC
MCHC: 34.6 g/dL (ref 30.0–36.0)
RDW: 13.6 % (ref 11.5–15.5)

## 2011-05-07 NOTE — Discharge Summary (Signed)
NAMEJENIPHER, Molly [name]              ACCOUNT NO.:  [id number]  MEDICAL RECORD NO.:  [id number]           PATIENT TYPE:  I  LOCATION:  9105                          FACILITY:  [facility]  PHYSICIAN:  Lesly Dukes, M.D. DATE OF BIRTH:  10-10-1986  DATE OF ADMISSION:  04/22/2011 DATE OF DISCHARGE:  04/26/2011                              DISCHARGE SUMMARY   ADMISSION DIAGNOSES: 1. Intrauterine pregnancy at 39 weeks. 2. Diabetes with insulin pump. 3. Unfavorable cervix. 4. EFW of 9-1/2 pounds  DISCHARGE DIAGNOSES: 1. Intrauterine pregnancy at 39 weeks. 2. Diabetes with insulin pump. 3. Unfavorable cervix. 4. EFW of 9-1/2 pounds 5. Failure to descend. 6. Anemia.  PROCEDURE:  Primary lower transverse cesarean section.  HOSPITAL COURSE:  Ms. Keach is a 25 year old primigravida who presented at 2 weeks' gestation for induction of labor due to diabetes mellitus.  Her prenatal care has been [facility] and began at approximately 8-[redacted] weeks gestation with her main complication being diabetes.  Most recent ultrasound the week prior to the induction showed estimated fetal weight to be 9-1/2 pounds.  The patient elected to attempt vaginal delivery.  Cytotec was used upon her arrival.  Her vital signs were stable.  Fetal heart rate was reassuring and reactive at times.  By the morning of May 17, the patient became completely dilated and after a combination of laboring down and pushing, she was having no further descent of the fetal vertex.  Cesarean section was recommended by Dr. Marice Potter, so the patient was prepped for the operating room.  Infant was a viable female, weight of 10 pounds 3 ounces, Apgar's of 9 at 1 minute and 9 at 5 minutes.  Infant was taken to the full-term nursery in good condition.  The patient tolerated the remainder of the cesarean section well and was taken to the recovery room.  By postop day #1, her vital signs were stable.  The patient  was breast-feeding.  Her sugars ranged from 74-232 and she was receiving help from the diabetes coordinator on pump management, especially considering the breast feeding.  Her CBC on postpartum day #1, hemoglobin was 7.5, hematocrit 21.4, white blood cell count 11.1, and platelets 122,000.  She expressed a desire for IUD for contraception. By postop day #2, the patient was doing well.  She was denying signs and symptoms of dizziness, syncope, or any other signs of anemia.  Her vital signs were stable.  Repeat CBC: hemoglobin 7.3, hematocrit 21.1, white blood cell count 6.3, and platelets 121,000.  CBC on admission: hemoglobin was 12.0, hematocrit 34.2, white blood cell count 8.9, and platelets 140,000.  Breast-feeding continued to go well.  Her CBGs were better controlled, ranging from 143-64.  Her physical exam was within normal limits.  Incision was healing well with Steri-Strips.  She was deemed to have received full benefit of her hospital stay and she was discharged home.  DISCHARGE INSTRUCTIONS:  Per postpartum handout.  DISCHARGE MEDICATIONS: 1. Motrin 600 mg 1 p.o. q.6 h. p.r.n. pain. 2. Percocet 1-2 p.o. q.4 h. p.r.n. pain. 3. Iron 1 p.o. b.i.d. 4. Colace 1 p.o.  b.i.d.  Discharge follow-up will occur at the [facility] in 4 weeks or sooner if needed.     Cam Hai, C.N.M.   ______________________________ Lesly Dukes, M.D.    KS/MEDQ  D:  04/26/2011  T:  04/27/2011  Job:  [id number]  Electronically Signed by Cam Hai C.N.M. on 05/01/2011 08:28:15 AM Electronically Signed by Elsie Lincoln M.D. on 05/07/2011 07:33:38 PM

## 2011-05-19 NOTE — Op Note (Signed)
Molly [name], Molly [name]              ACCOUNT NO.:  [id number]  MEDICAL RECORD NO.:  [id number]           PATIENT TYPE:  I  LOCATION:  9160                          FACILITY:  [facility]  PHYSICIAN:  Allie Bossier, MD        DATE OF BIRTH:  August 24, 1986  DATE OF PROCEDURE:  04/24/2011 DATE OF DISCHARGE:                              OPERATIVE REPORT   PREOPERATIVE DIAGNOSIS:  Failed induction at 39 weeks for class C diabetes mellitus, chorioamnionitis.  POSTOPERATIVE DIAGNOSIS:  Failed induction at 39 weeks for class C diabetes mellitus, chorioamnionitis plus macrosomia.  SURGEON:  Mikey Maffett C. Marice Potter, MD  ANESTHESIA:  Epidural, Tyrone Apple. Malen Gauze, MD  PROCEDURE:  Primary low transverse cesarean section.  COMPLICATIONS:  None.  ESTIMATED BLOOD LOSS:  1000 mL.  SPECIMENS:  Cord blood and placenta.  FINDINGS: 1. Living female infant with weight of 10 pounds 3 ounces and Apgars     of 9 and 9 at 1 and 5 minutes. 2. Normal pelvic anatomy. 3. Intact placenta with three-vessel cord.  DETAILS OF PROCEDURE AND FINDINGS:  The risks, benefits, and alternatives of surgery were explained, understood, and accepted.  I was very clear about the risk of damage to the bladder because the baby's low position in the pelvis.  Risks were explained, understood, and accepted.  Consents were signed.  She was taken to the operating room. Her epidural was bolused for surgery..  Abdomen was prepped and draped in usual sterile fashion.  After adequate anesthesia was assured, a transverse incision was made approximately 2 cm above her symphysis pubis.  Please note this incision went through a skull and star tattoo that was located at the right side of her lower abdomen.  Incision was carried down through the subcutaneous tissue.  Please note that prior to the incision, I injected 30 mL of 0.5% Marcaine for postop pain relief. Fascia was scored to midline.  Fascial incision was extended bilaterally and curved slightly  upwards.  Bleeding encountered and was cauterized with the Bovie.  The middle 50% of the rectus muscles were separated in transverse fashion.  Peritoneum was entered with hemostats.  Peritoneal incision was extended with the Bovie taking care to avoid the bladder. An Alexis self-retaining retractor was placed.  A transverse incision was made on the extremely well-developed thin lower uterine segment. Amniotomy revealed very little fluid.  The baby was delivered from the vertex presentation.  Please note that the midwife, Wynelle Bourgeois, was pushing the head upwards through the vagina to aid in delivery.  Mouth and nostrils were suctioned prior to delivery of the shoulders.  The shoulders were delivered.  Cord was clamped and cut.  The baby was transferred to the NICU personnel for routine care as weight and Apgars are listed above.  Cord blood samples obtained.  The placenta was extracted with traction and noted to be intact.  The uterine interior was exteriorized and cleaned with a dry lap sponge.  It was extremely boggy.  She was given 40 units of Pitocin IV and when the bogginess of the uterus continued, she was given 20 units of  Pitocin in the muscle of the uterus.  The uterus then began to contract.  Excellent hemostasis was maintained.  The paper thin lower uterine segment was closed in 1 layer of 2-0 Vicryl in running locking suture.  Excellent hemostasis was noted.  The uterus was packed back into the abdominal cavity after observing the normal-appearing adnexa.  The uterine incision was again inspected and noted to be hemostatic as were the rectus muscles and rectus fascia.  The fascia was closed with a 0 Vicryl in a running nonlocking fashion.  No defects were palpable.  The subcutaneous tissue was irrigated, cleaned, and dried.  A subcuticular closure was done, taking great care to reapproximate the edges of her tattoo, yielding an excellent cosmetic result.  Steri-Strips were  placed.  Her Foley catheter did have a blood tinged to her urine during the case, which was not surprising based on the position of the child baby that began to clear up before the case was over.  The instrument, sponge, and needle counts were correct.  She tolerated the procedure well.     Allie Bossier, MD     MCD/MEDQ  D:  04/24/2011  T:  04/24/2011  Job:  [id number]  Electronically Signed by Nicholaus Bloom MD on 05/19/2011 09:45:35 AM

## 2011-05-28 ENCOUNTER — Ambulatory Visit: Payer: PRIVATE HEALTH INSURANCE | Admitting: Obstetrics and Gynecology

## 2011-05-29 ENCOUNTER — Ambulatory Visit: Payer: PRIVATE HEALTH INSURANCE | Admitting: Physician Assistant

## 2011-07-03 ENCOUNTER — Ambulatory Visit: Payer: PRIVATE HEALTH INSURANCE | Admitting: Obstetrics and Gynecology

## 2011-07-09 ENCOUNTER — Ambulatory Visit: Payer: PRIVATE HEALTH INSURANCE | Admitting: Obstetrics and Gynecology

## 2011-08-04 ENCOUNTER — Telehealth: Payer: Self-pay | Admitting: Obstetrics and Gynecology

## 2011-08-04 NOTE — Telephone Encounter (Signed)
Patient called with billing questions. Gave her the billing phone number: [phone]. Pt agrees.

## 2011-08-07 ENCOUNTER — Encounter: Payer: Self-pay | Admitting: Obstetrics and Gynecology

## 2011-08-07 ENCOUNTER — Ambulatory Visit (INDEPENDENT_AMBULATORY_CARE_PROVIDER_SITE_OTHER): Payer: PRIVATE HEALTH INSURANCE | Admitting: Obstetrics and Gynecology

## 2011-08-07 VITALS — BP 134/87 | HR 110 | Temp 97.5°F | Ht 65.0 in | Wt 145.9 lb

## 2011-08-07 DIAGNOSIS — Z3049 Encounter for surveillance of other contraceptives: Secondary | ICD-10-CM

## 2011-08-07 DIAGNOSIS — E119 Type 2 diabetes mellitus without complications: Secondary | ICD-10-CM | POA: Insufficient documentation

## 2011-08-07 DIAGNOSIS — Z309 Encounter for contraceptive management, unspecified: Secondary | ICD-10-CM

## 2011-08-07 DIAGNOSIS — Z124 Encounter for screening for malignant neoplasm of cervix: Secondary | ICD-10-CM | POA: Diagnosis not present

## 2011-08-07 MED ORDER — MEDROXYPROGESTERONE ACETATE 150 MG/ML IM SUSP: 150.0000 mg | Freq: Once | INTRAMUSCULAR | Status: DC

## 2011-08-07 MED ORDER — MEDROXYPROGESTERONE ACETATE 150 MG/ML IM SUSP
150.0000 mg | Freq: Once | INTRAMUSCULAR | Status: AC
  Administered 2011-08-07: 150 mg via INTRAMUSCULAR

## 2011-08-07 NOTE — Progress Notes (Signed)
Patient is a 25 year old gravida 1 para 1001 who delivered a baby girl 10 lbs. 3 oz. by cesarean section on 04/24/2011. 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 She is a type I diabetic on insulin and she nursed after the birth of her baby now is nursing irregularly because she is back to work. She desires Depo-Provera for contraception. We'll also have her fill out the papers for a Jearld Adjutant IUD which is not covered by her insurance.  We are repeating her Pap smear which was ASCUS plus high-risk HPV during her pregnancy. Exam external genitalia was normal BUS within normal limits vagina was clean and well rugated the cervix was clean nulliparous and showed abundant clear mucous. Smear was taken.  The patient will get 150 mg IM of Depo-Provera.

## 2011-08-13 ENCOUNTER — Telehealth: Payer: Self-pay | Admitting: *Deleted

## 2011-08-13 NOTE — Telephone Encounter (Signed)
Pt scheduled for colposcopy on 09/24/11 at 145pm with Dr. Adrian Blackwater due to abnormal pap smear. Attempted to call patient to inform her of the appt and need for colpo, left her a message to call us back.

## 2011-08-13 NOTE — Telephone Encounter (Signed)
Message copied by Mannie Stabile on Wed Aug 13, 2011  3:32 PM ------      Message from: ROSE, PHILLIP D      Created: Wed Aug 13, 2011  2:00 PM       Sched. colposcopy

## 2011-08-14 NOTE — Telephone Encounter (Signed)
Patient called and left a message 08/13/11 at 4:58 pm. Called pt. This am and notified her she had an abnormal papsmear and needs a colposcopy- pt states she is familiar because she had one before 2or 3 years ago. Gave pt appointment  Information. Pt denied any questions.

## 2011-09-24 ENCOUNTER — Encounter: Payer: PRIVATE HEALTH INSURANCE | Admitting: Family Medicine

## 2011-10-16 ENCOUNTER — Encounter: Payer: PRIVATE HEALTH INSURANCE | Admitting: Physician Assistant

## 2011-10-23 ENCOUNTER — Ambulatory Visit: Payer: PRIVATE HEALTH INSURANCE

## 2011-11-10 IMAGING — US US OB FOLLOW-UP
1 series · 14 of 28 positions shown · non-contrast
Comparison: none

[Series 1: us ob follow-up · 0.27mm/px · 14 of 42 slices shown]
[im 2/42]
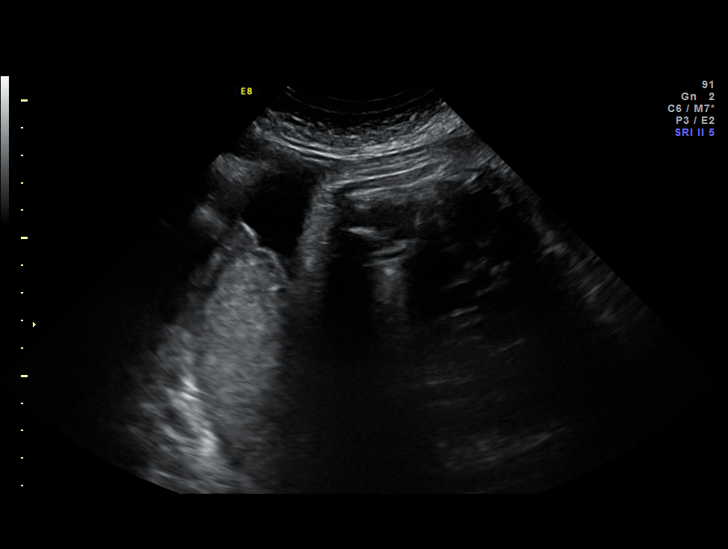
[im 5/42]
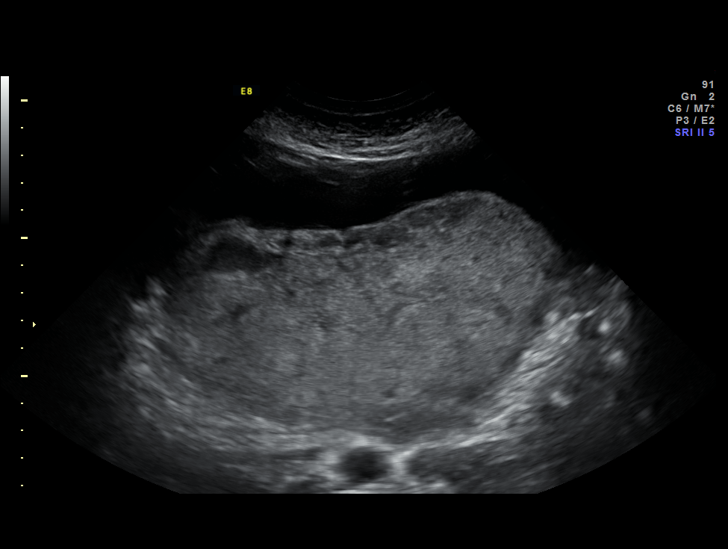
[im 8/42]
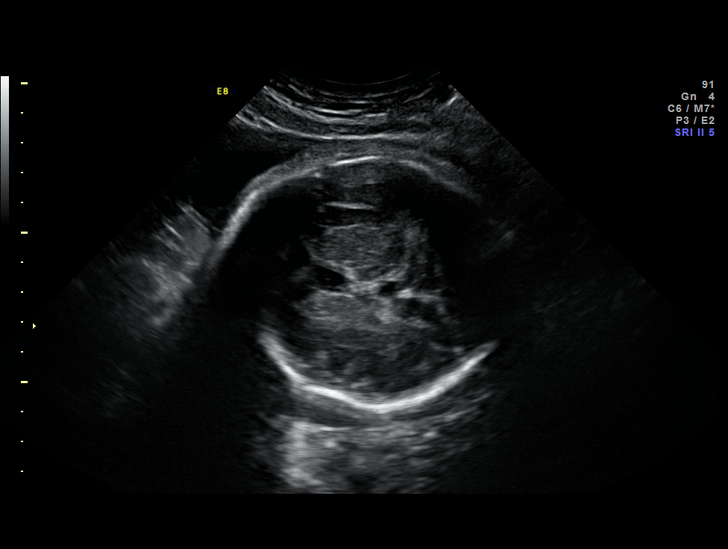
[im 11/42]
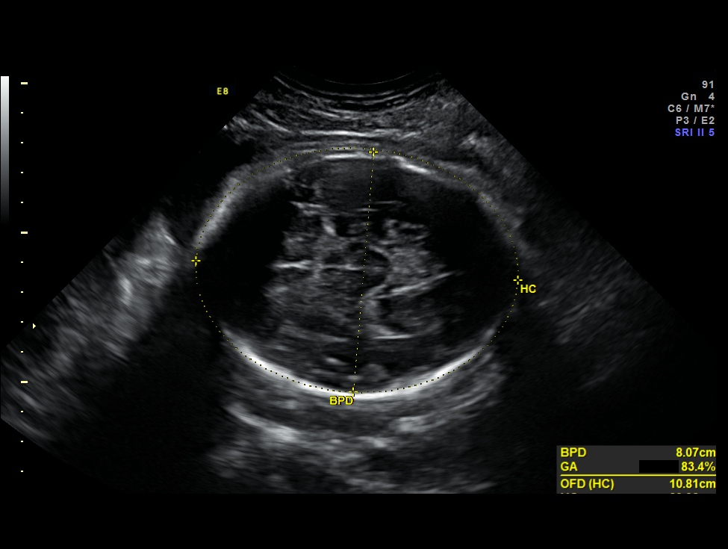
[im 14/42]
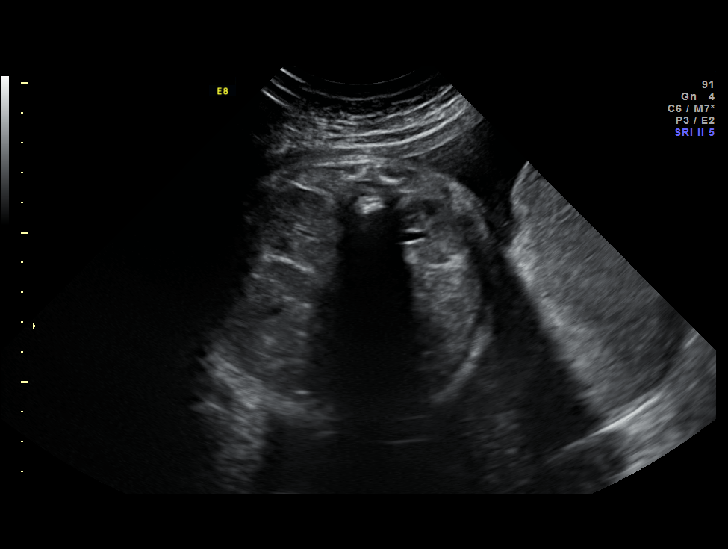
[im 17/42]
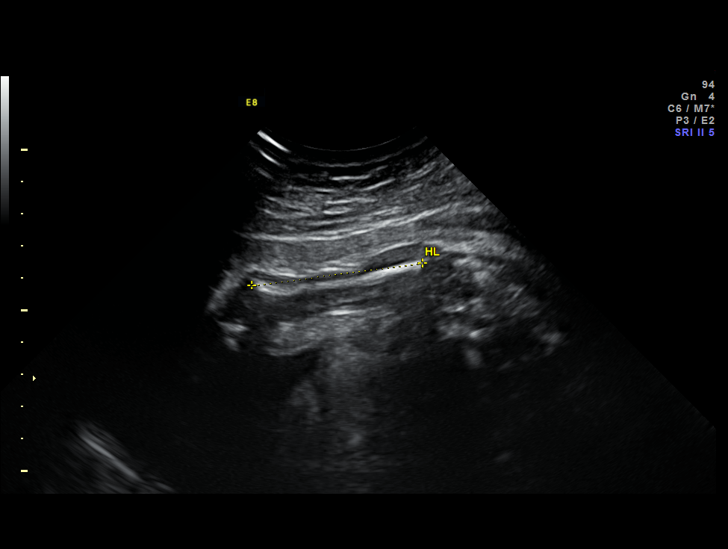
[im 20/42]
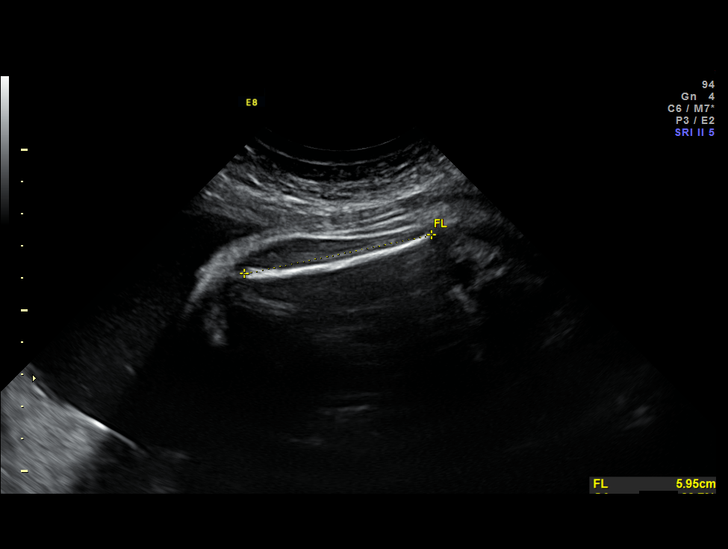
[im 23/42]
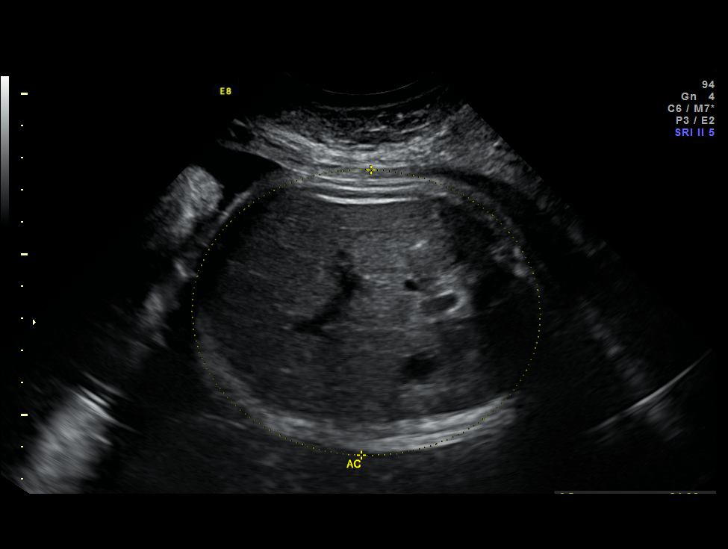
[im 26/42]
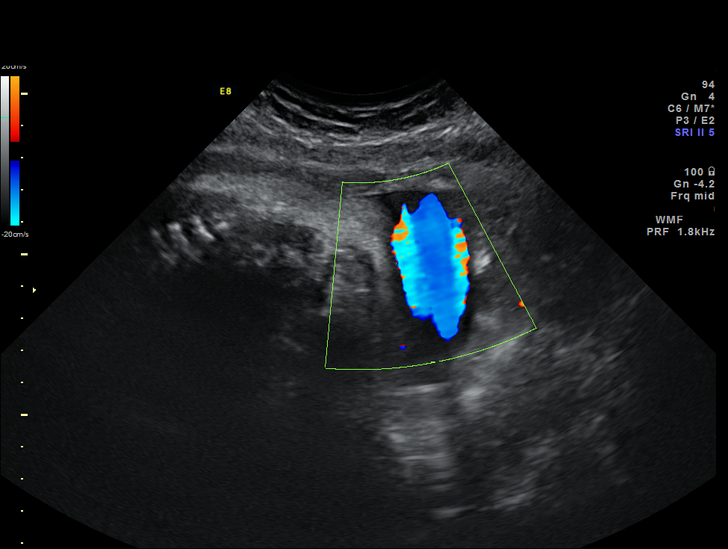
[im 29/42]
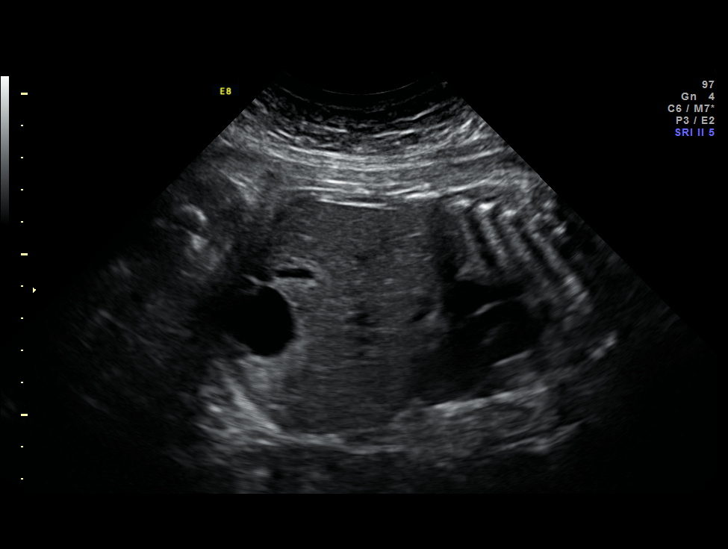
[im 32/42]
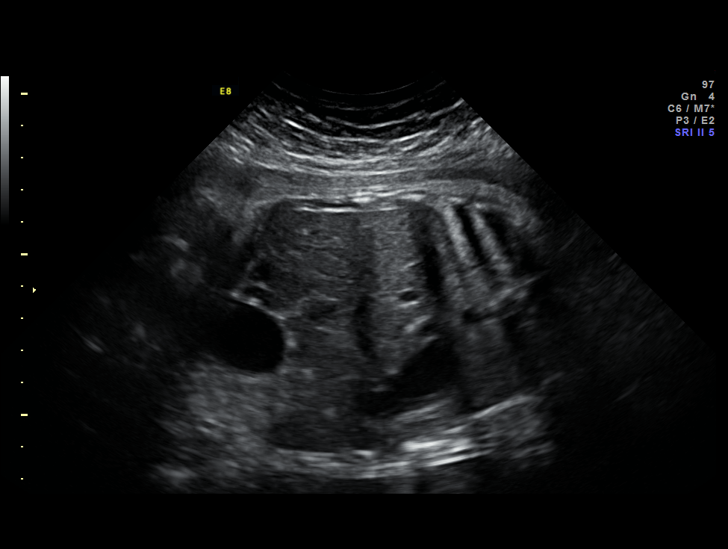
[im 35/42]
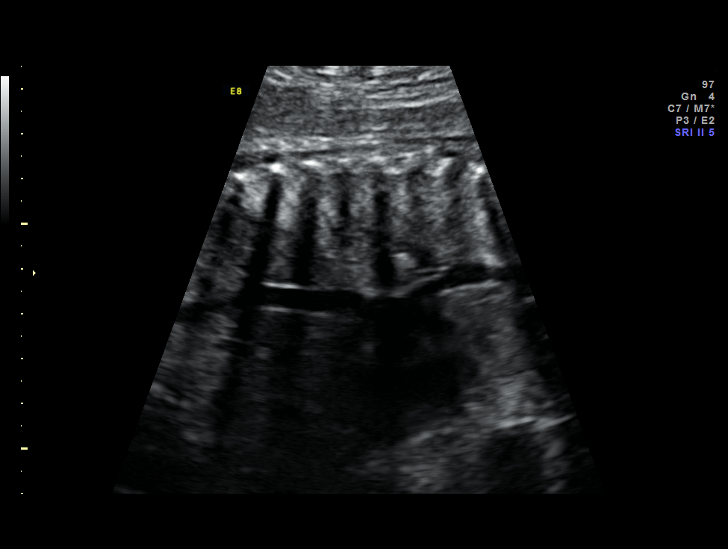
[im 38/42]
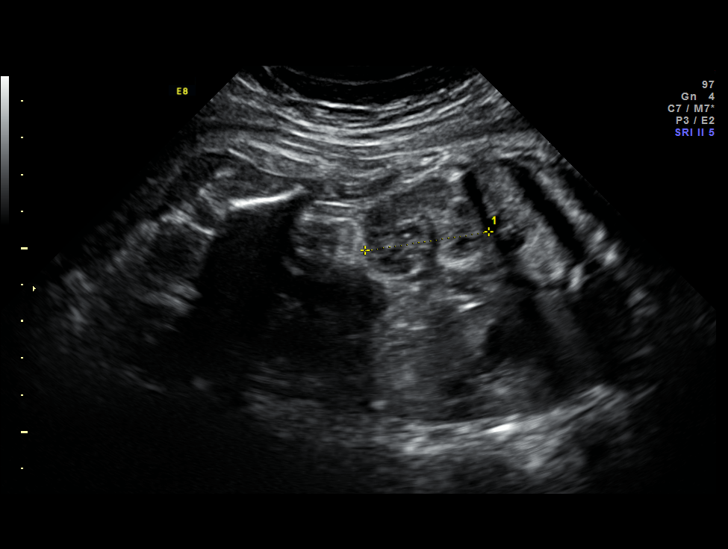
[im 42/42]
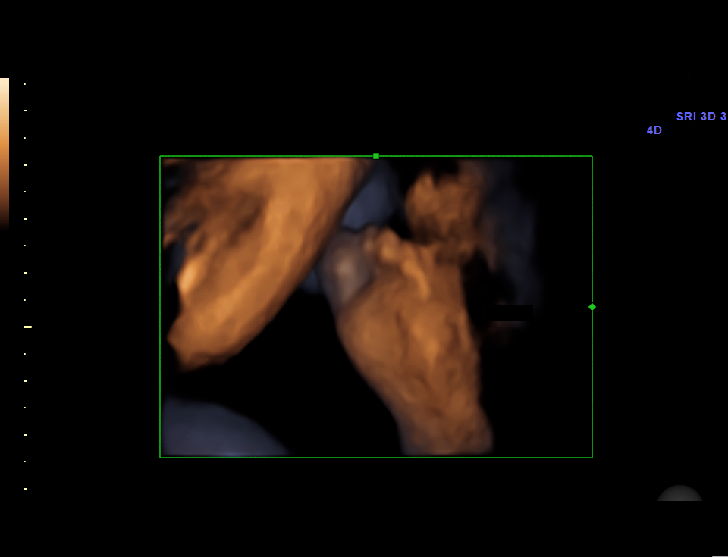

[14 of 28 positions shown; findings below may reference images not displayed]

Canned report from images found in remote index.

Refer to host system for actual result text.

## 2011-12-12 IMAGING — US US OB FOLLOW-UP
1 series · 14 of 27 positions shown · non-contrast
Comparison: none

[Series 1: us ob follow-up · 0.27mm/px · 14 of 27 slices shown]
[im 1/27]
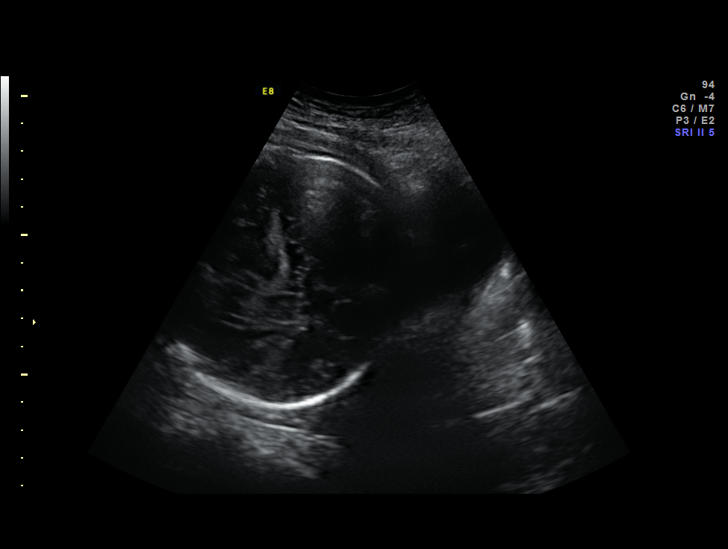
[im 3/27]
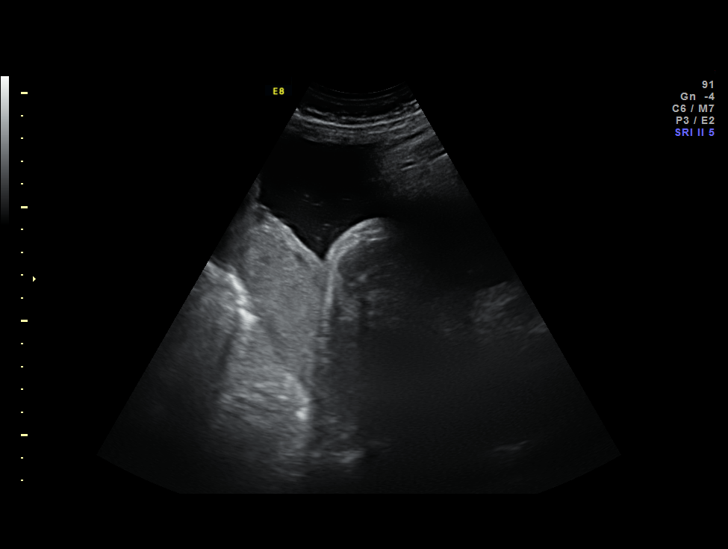
[im 5/27]
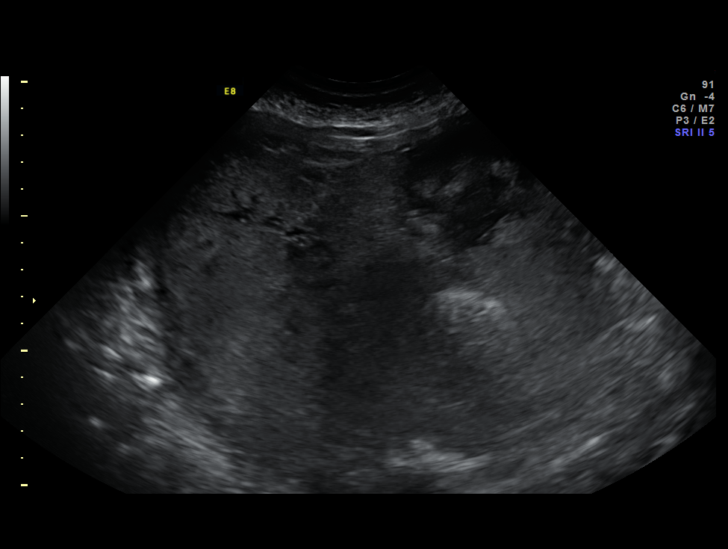
[im 7/27]
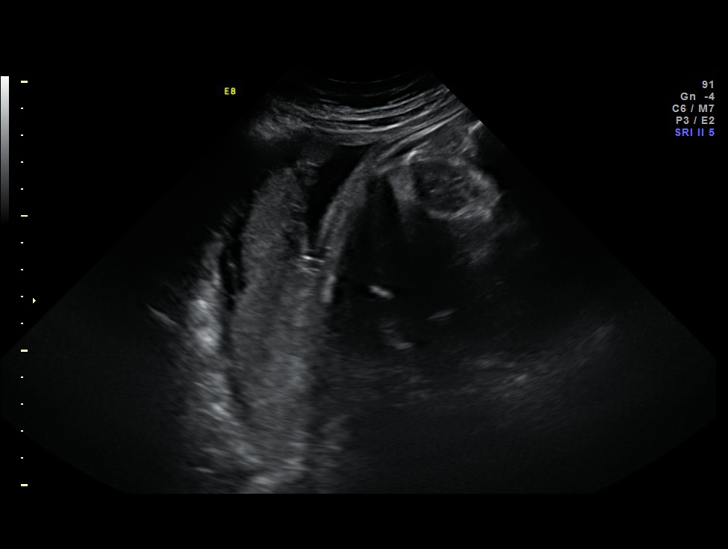
[im 9/27]
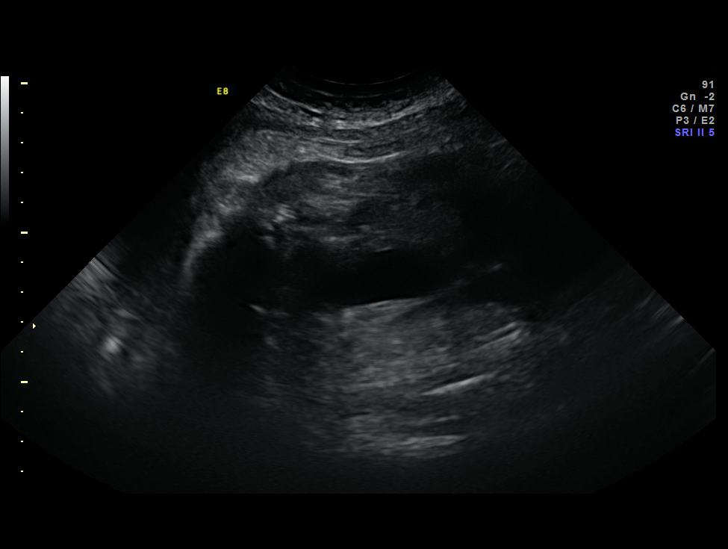
[im 11/27]
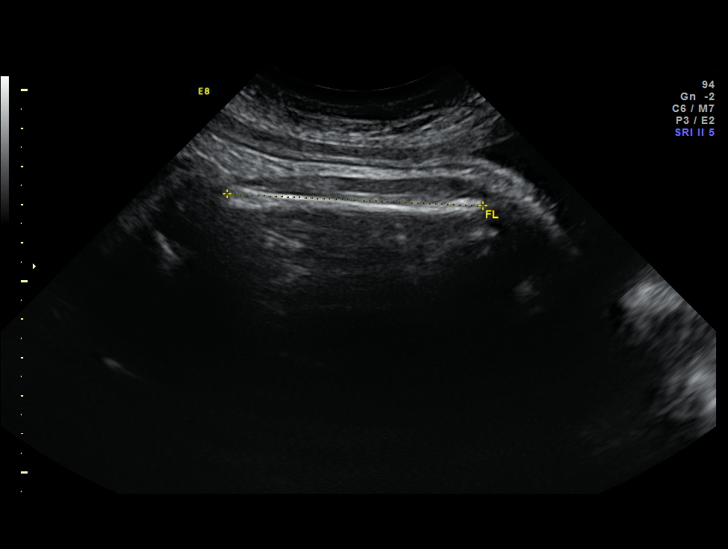
[im 13/27]
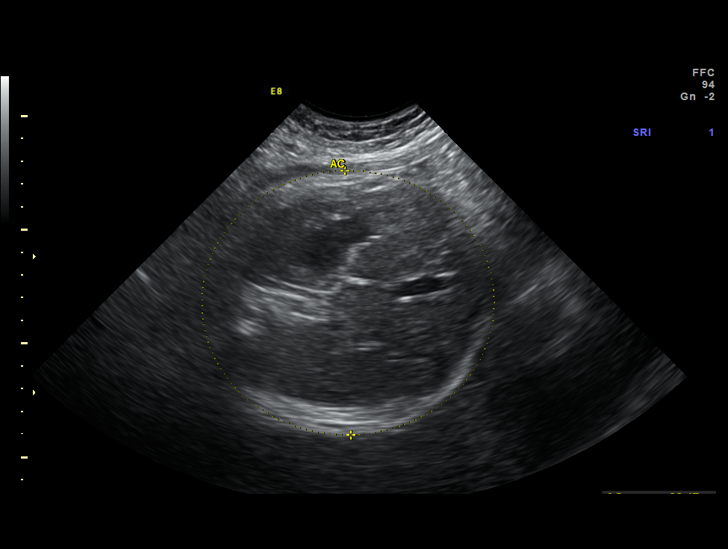
[im 15/27]
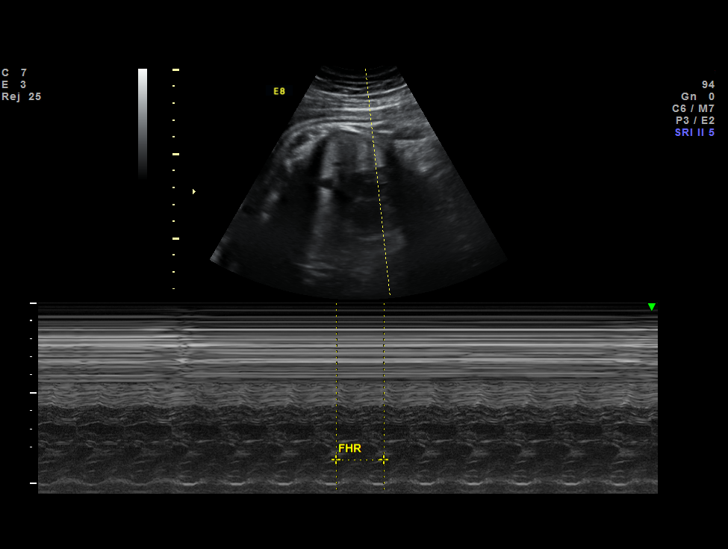
[im 17/27]
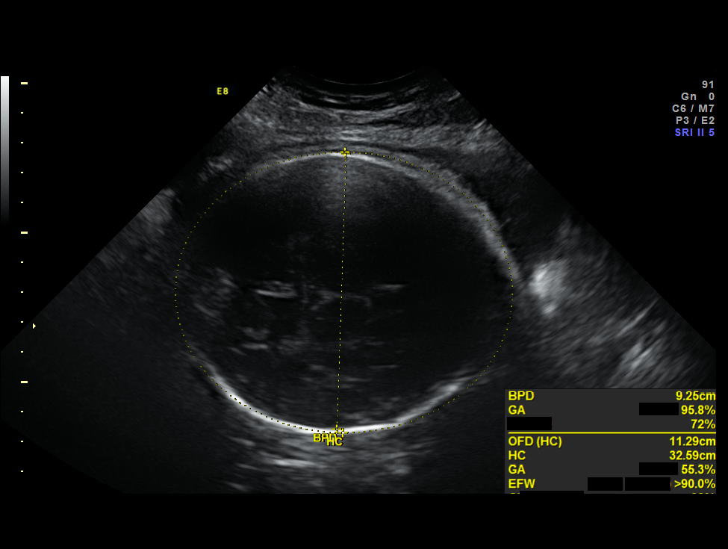
[im 19/27]
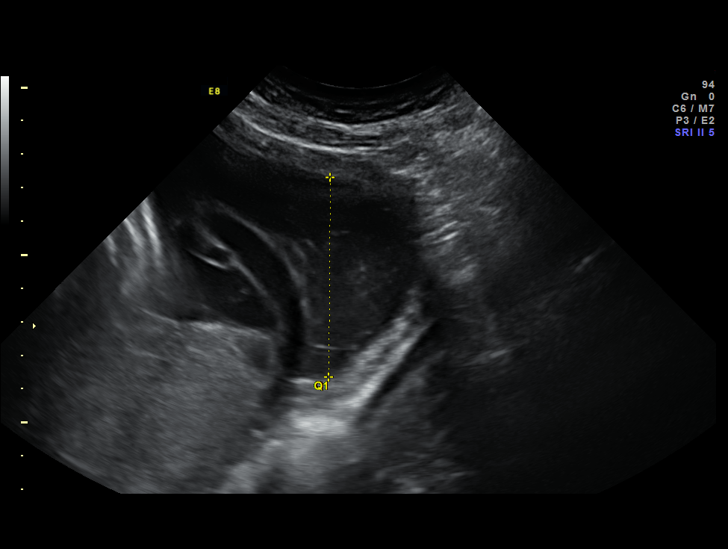
[im 21/27]
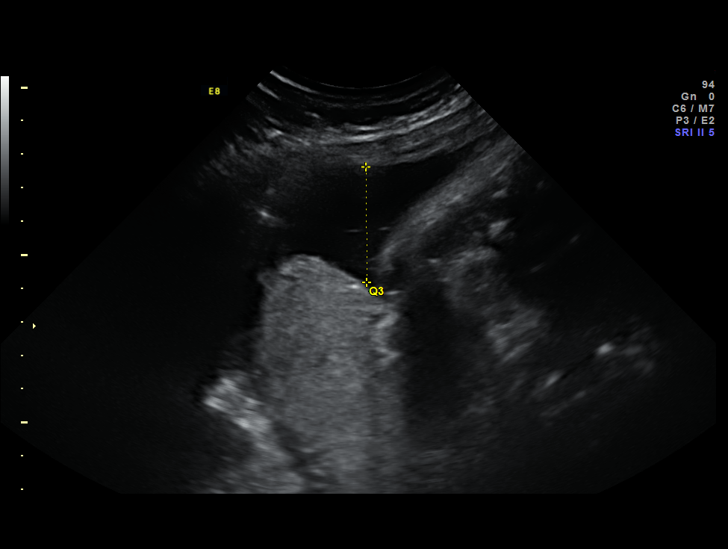
[im 23/27]
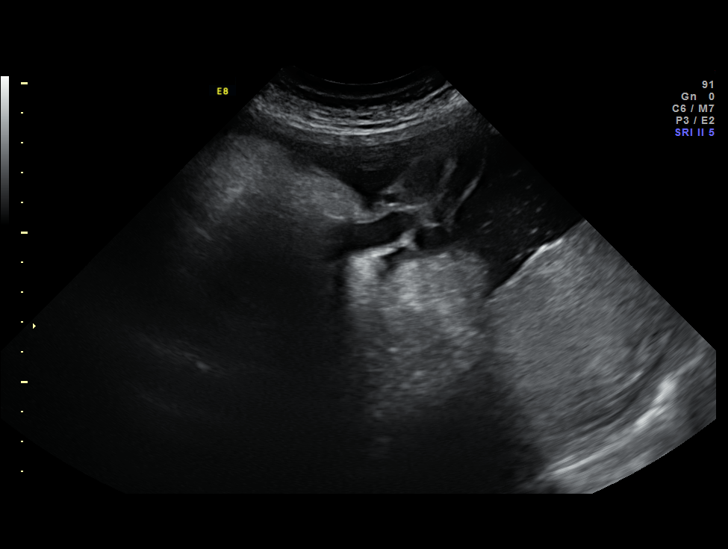
[im 25/27]
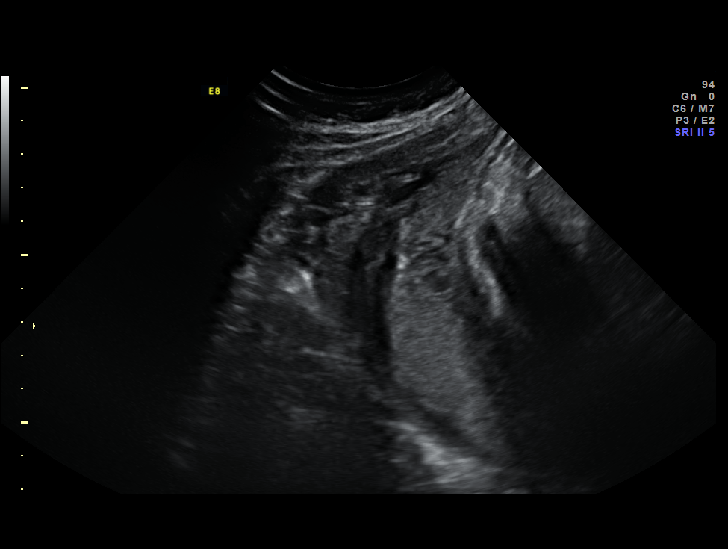
[im 27/27]
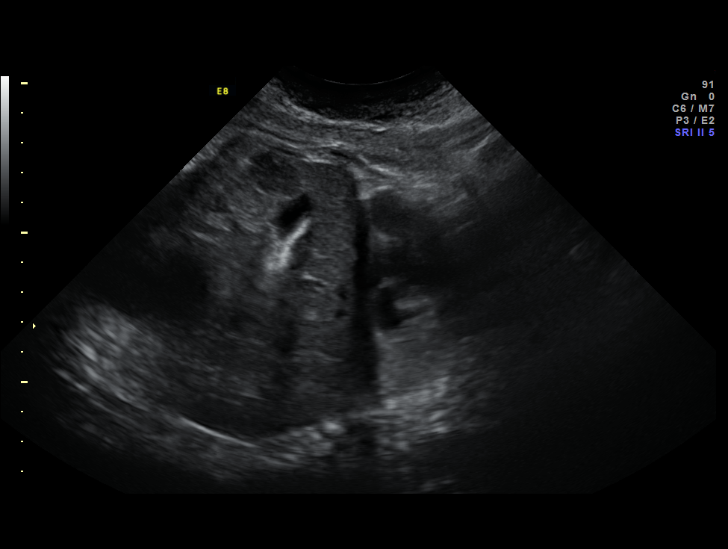

[14 of 27 positions shown; findings below may reference images not displayed]

Canned report from images found in remote index.

Refer to host system for actual result text.

## 2012-10-01 ENCOUNTER — Other Ambulatory Visit: Payer: Self-pay

## 2013-10-05 ENCOUNTER — Other Ambulatory Visit: Payer: Self-pay

## 2013-11-09 LAB — OB RESULTS CONSOLE RPR: RPR: NONREACTIVE

## 2013-11-09 LAB — OB RESULTS CONSOLE RUBELLA ANTIBODY, IGM: Rubella: IMMUNE

## 2013-11-09 LAB — OB RESULTS CONSOLE GC/CHLAMYDIA
Chlamydia: NEGATIVE
GC PROBE AMP, GENITAL: NEGATIVE

## 2013-11-09 LAB — OB RESULTS CONSOLE HEPATITIS B SURFACE ANTIGEN: HEP B S AG: NEGATIVE

## 2013-12-08 NOTE — L&D Delivery Note (Addendum)
Cesarean Section Procedure Note  Indications: previous uterine incision kerr x1 and patient with type I diabetes  Pre-operative Diagnosis: 81 week SIUP with prior low transverse cesarean delivery Type I diabetes  Post-operative Diagnosis: same  Surgeon: Caffie Damme   Assistants: Claris Gower  Anesthesia: Spinal anesthesia  ASA Class: 2  Procedure Details   The patient was seen in the Holding Room. The risks, benefits, complications, treatment options, and expected outcomes were discussed with the patient.  The patient concurred with the proposed plan, giving informed consent.  The site of surgery properly noted/marked. The patient was taken to Operating Room # 9, identified as Molly, [name] and the procedure verified as C-Section Delivery. A Time Out was held and the above information confirmed.  After induction of anesthesia, the patient was placed in the dorsal supine position with a leftward tilt, draped and prepped in the usual sterile manner. A Pfannenstiel incision was made and carried down through the subcutaneous tissue to the fascia.  The fascia was incised in the midline and the fascial incision was extended laterally with Mayo scissors. The superior aspect of the fascial incision was grasped with Coker clamps x2, tented up and the rectus muscles dissected off sharply with the scalpel. The rectus was then dissected off with blunt dissection and Mayo scissors inferiorly. The rectus muscles were separated in the midline. The abdominal peritoneum was identified, tented up, entered sharply, and the incision was extended superiorly and inferiorly with good visualization of the bladder. The Alexis retractor was then deployed. The vesicouterine peritoneum was identified, tented up, entered sharply with Metzenbaum scissors, and the bladder flap was created digitally. Scalpel was then used to make a low transverse incision on the uterus which was extended laterally with  blunt  dissection. The fetal vertex was identified, delivered easily through the uterine incision followed by the body. The A live female infant was bulb suctioned on the operative field cried vigorously, cord was clamped and cut and the infant was passed to the waiting neonatologist. Apgars 8/9. Placenta was then delivered spontaneously, intact and appear normal, the uterus was cleared of all clot and debris. The uterine incision was repaired with #0 Monocryl in running locked fashion. A second imbricating suture was performed. Hemostasis was excellent Ovaries and tubes were inspected and normal. Irrigation with sterile warm normal saline was used. The Alexis retractor was removed. The abdominal peritoneum was reapproximated with 2-0 chromic in a running fashion, the rectus muscles was reapproximated with #2 chromic in interrupted fashion. The fascia was closed with 1 PDS in a running fashion from both ends of fascia,  Met in the middle. The subcutaneous layer was re-approximated with interrupted suture of 2-0 plain. The skin was closed with 4-0 vicryl in a subcuticular fashion and dressed with benzoine, steri strips and pressure dressing. All sponge lap and needle counts were correct x2. Patient tolerated the procedure well and recovered in stable condition following the procedure.  Instrument, sponge, and needle counts were correct prior the abdominal closure and at the conclusion of the case.   Findings: Live female infant, Apgars 8/9, clear fluid normal appearing placenta, normal uterus, bilateral tubes and ovaries  Estimated Blood Loss:  878mL         Drains: Foley catheter 300 mL of clear urine at end of procedure                 Specimens: none         Implants: none  Complications:  None; patient tolerated the procedure well.         Disposition: PACU - hemodynamically stable.  Molly [name] Molly [name]

## 2014-04-20 ENCOUNTER — Inpatient Hospital Stay (HOSPITAL_COMMUNITY): Payer: BC Managed Care – PPO

## 2014-04-20 ENCOUNTER — Inpatient Hospital Stay (HOSPITAL_COMMUNITY)
Admission: AD | Admit: 2014-04-20 | Discharge: 2014-04-20 | Disposition: A | Discharge status: Home / Self Care | Payer: BC Managed Care – PPO | Source: Ambulatory Visit | Attending: Obstetrics and Gynecology | Admitting: Obstetrics and Gynecology

## 2014-04-20 ENCOUNTER — Encounter (HOSPITAL_COMMUNITY): Payer: Self-pay | Admitting: General Practice

## 2014-04-20 DIAGNOSIS — O368390 Maternal care for abnormalities of the fetal heart rate or rhythm, unspecified trimester, not applicable or unspecified: Secondary | ICD-10-CM

## 2014-04-20 DIAGNOSIS — O24919 Unspecified diabetes mellitus in pregnancy, unspecified trimester: Secondary | ICD-10-CM | POA: Insufficient documentation

## 2014-04-20 DIAGNOSIS — E119 Type 2 diabetes mellitus without complications: Secondary | ICD-10-CM | POA: Insufficient documentation

## 2014-04-20 NOTE — MAU Note (Signed)
Sent from office for BPP and NST.  Pt is IDDM.

## 2014-04-20 NOTE — MAU Provider Note (Signed)
  History     CSN: [id number]  Arrival date and time: 04/20/14 1713   None     Chief Complaint  Patient presents with  . non-reactive tracing    HPI This is a 28 y.o. female at 233w1d who presents from office for NST and BPP for a suspicious tracing on office NST.   RN Note: Sent from office for BPP and NST. Pt is IDDM.        OB History   Grav Para Term Preterm Abortions TAB SAB Ect Mult Living   2 1 1       1       Past Medical History  Diagnosis Date  . Diabetes mellitus   . Abnormal Pap smear of cervix   . Herpes     Past Surgical History  Procedure Laterality Date  . Biopsy bowel    . Lymph node biopsy    . Cesarean section      Family History  Problem Relation Age of Onset  . Hyperlipidemia Father   . Hypertension Father     History  Substance Use Topics  . Smoking status: Never Smoker   . Smokeless tobacco: Not on file  . Alcohol Use: 1.2 oz/week    2 Cans of beer per week    Allergies: No Known Allergies  Prescriptions prior to admission  Medication Sig Dispense Refill  . cholecalciferol (VITAMIN D) 1000 UNITS tablet Take 2,000 Units by mouth daily.      . Prenatal Vit-Fe Fumarate-FA (PRENATAL MULTIVITAMIN) TABS tablet Take 1 tablet by mouth at bedtime.      . insulin aspart (NOVOLOG) 100 UNIT/ML injection Inject 90 Units into the skin daily. Patient is using an insulin pump that is on a sliding scale but averages about 90 units continuously throughout the day.        Review of Systems  Constitutional: Negative for fever, chills and malaise/fatigue.  Gastrointestinal: Negative for nausea, vomiting and abdominal pain.  Neurological: Negative for headaches.   Physical Exam   Blood pressure 115/75, pulse 102, temperature 98.6 F (37 C), temperature source Oral, resp. rate 18, last menstrual period 08/17/2013, not currently breastfeeding.  Physical Exam  Constitutional: She is oriented to person, place, and time. She appears well-developed  and well-nourished. No distress.  HENT:  Head: Normocephalic.  Cardiovascular: Normal rate.   Respiratory: Effort normal.  GI: Soft. There is no tenderness. There is no rebound and no guarding.  Musculoskeletal: Normal range of motion.  Neurological: She is alert and oriented to person, place, and time.  Skin: Skin is warm and dry.  Psychiatric: She has a normal mood and affect.    MAU Course  Procedures  MDM NST reactive BPP 8/8, AFI subjectively normal with 3cm pocket  Assessment and Plan  A:  SIUP at 3877w2d        Reactive NST       Reassuring BPP  P:   Discharge home        Dr Tenny Crawoss notified of results  Aviva SignsMarie L Tameem Pullara 04/20/2014, 6:17 PM

## 2014-04-20 NOTE — Discharge Instructions (Signed)
Third Trimester of Pregnancy  The third trimester is from week 29 through week 42, months 7 through 9. The third trimester is a time when the fetus is growing rapidly. At the end of the ninth month, the fetus is about 20 inches in length and weighs 6 10 pounds.   BODY CHANGES  Your body goes through many changes during pregnancy. The changes vary from woman to woman.    Your weight will continue to increase. You can expect to gain 25 35 pounds (11 16 kg) by the end of the pregnancy.   You may begin to get stretch marks on your hips, abdomen, and breasts.   You may urinate more often because the fetus is moving lower into your pelvis and pressing on your bladder.   You may develop or continue to have heartburn as a result of your pregnancy.   You may develop constipation because certain hormones are causing the muscles that push waste through your intestines to slow down.   You may develop hemorrhoids or swollen, bulging veins (varicose veins).   You may have pelvic pain because of the weight gain and pregnancy hormones relaxing your joints between the bones in your pelvis. Back aches may result from over exertion of the muscles supporting your posture.   Your breasts will continue to grow and be tender. A yellow discharge may leak from your breasts called colostrum.   Your belly button may stick out.   You may feel short of breath because of your expanding uterus.   You may notice the fetus "dropping," or moving lower in your abdomen.   You may have a bloody mucus discharge. This usually occurs a few days to a week before labor begins.   Your cervix becomes thin and soft (effaced) near your due date.  WHAT TO EXPECT AT YOUR PRENATAL EXAMS   You will have prenatal exams every 2 weeks until week 36. Then, you will have weekly prenatal exams. During a routine prenatal visit:   You will be weighed to make sure you and the fetus are growing normally.   Your blood pressure is taken.   Your abdomen will be  measured to track your baby's growth.   The fetal heartbeat will be listened to.   Any test results from the previous visit will be discussed.   You may have a cervical check near your due date to see if you have effaced.  At around 36 weeks, your caregiver will check your cervix. At the same time, your caregiver will also perform a test on the secretions of the vaginal tissue. This test is to determine if a type of bacteria, Group B streptococcus, is present. Your caregiver will explain this further.  Your caregiver may ask you:   What your birth plan is.   How you are feeling.   If you are feeling the baby move.   If you have had any abnormal symptoms, such as leaking fluid, bleeding, severe headaches, or abdominal cramping.   If you have any questions.  Other tests or screenings that may be performed during your third trimester include:   Blood tests that check for low iron levels (anemia).   Fetal testing to check the health, activity level, and growth of the fetus. Testing is done if you have certain medical conditions or if there are problems during the pregnancy.  FALSE LABOR  You may feel small, irregular contractions that eventually go away. These are called Braxton Hicks contractions, or   false labor. Contractions may last for hours, days, or even weeks before true labor sets in. If contractions come at regular intervals, intensify, or become painful, it is best to be seen by your caregiver.   SIGNS OF LABOR    Menstrual-like cramps.   Contractions that are 5 minutes apart or less.   Contractions that start on the top of the uterus and spread down to the lower abdomen and back.   A sense of increased pelvic pressure or back pain.   A watery or bloody mucus discharge that comes from the vagina.  If you have any of these signs before the 37th week of pregnancy, call your caregiver right away. You need to go to the hospital to get checked immediately.  HOME CARE INSTRUCTIONS    Avoid all  smoking, herbs, alcohol, and unprescribed drugs. These chemicals affect the formation and growth of the baby.   Follow your caregiver's instructions regarding medicine use. There are medicines that are either safe or unsafe to take during pregnancy.   Exercise only as directed by your caregiver. Experiencing uterine cramps is a good sign to stop exercising.   Continue to eat regular, healthy meals.   Wear a good support bra for breast tenderness.   Do not use hot tubs, steam rooms, or saunas.   Wear your seat belt at all times when driving.   Avoid raw meat, uncooked cheese, cat litter boxes, and soil used by cats. These carry germs that can cause birth defects in the baby.   Take your prenatal vitamins.   Try taking a stool softener (if your caregiver approves) if you develop constipation. Eat more high-fiber foods, such as fresh vegetables or fruit and whole grains. Drink plenty of fluids to keep your urine clear or pale yellow.   Take warm sitz baths to soothe any pain or discomfort caused by hemorrhoids. Use hemorrhoid cream if your caregiver approves.   If you develop varicose veins, wear support hose. Elevate your feet for 15 minutes, 3 4 times a day. Limit salt in your diet.   Avoid heavy lifting, wear low heal shoes, and practice good posture.   Rest a lot with your legs elevated if you have leg cramps or low back pain.   Visit your dentist if you have not gone during your pregnancy. Use a soft toothbrush to brush your teeth and be gentle when you floss.   A sexual relationship may be continued unless your caregiver directs you otherwise.   Do not travel far distances unless it is absolutely necessary and only with the approval of your caregiver.   Take prenatal classes to understand, practice, and ask questions about the labor and delivery.   Make a trial run to the hospital.   Pack your hospital bag.   Prepare the baby's nursery.   Continue to go to all your prenatal visits as directed  by your caregiver.  SEEK MEDICAL CARE IF:   You are unsure if you are in labor or if your water has broken.   You have dizziness.   You have mild pelvic cramps, pelvic pressure, or nagging pain in your abdominal area.   You have persistent nausea, vomiting, or diarrhea.   You have a bad smelling vaginal discharge.   You have pain with urination.  SEEK IMMEDIATE MEDICAL CARE IF:    You have a fever.   You are leaking fluid from your vagina.   You have spotting or bleeding from your vagina.     You have severe abdominal cramping or pain.   You have rapid weight loss or gain.   You have shortness of breath with chest pain.   You notice sudden or extreme swelling of your face, hands, ankles, feet, or legs.   You have not felt your baby move in over an hour.   You have severe headaches that do not go away with medicine.   You have vision changes.  Document Released: 11/18/2001 Document Revised: 07/27/2013 Document Reviewed: 01/25/2013  Exit[employer] Patient Information 2014 [employer].

## 2014-04-28 ENCOUNTER — Other Ambulatory Visit (HOSPITAL_COMMUNITY): Payer: Self-pay | Admitting: Obstetrics and Gynecology

## 2014-04-28 DIAGNOSIS — O24919 Unspecified diabetes mellitus in pregnancy, unspecified trimester: Secondary | ICD-10-CM

## 2014-05-03 ENCOUNTER — Ambulatory Visit (HOSPITAL_COMMUNITY): Payer: BC Managed Care – PPO

## 2014-05-09 ENCOUNTER — Encounter (HOSPITAL_COMMUNITY): Payer: Self-pay | Admitting: Pharmacy Technician

## 2014-05-15 ENCOUNTER — Encounter (HOSPITAL_COMMUNITY): Payer: Self-pay

## 2014-05-15 ENCOUNTER — Encounter (HOSPITAL_COMMUNITY)
Admission: RE | Admit: 2014-05-15 | Discharge: 2014-05-15 | Disposition: A | Discharge status: Home / Self Care | Payer: BC Managed Care – PPO | Source: Ambulatory Visit | Attending: Obstetrics & Gynecology | Admitting: Obstetrics & Gynecology

## 2014-05-15 HISTORY — DX: Other non-diabetic proliferative retinopathy, bilateral: H35.23

## 2014-05-15 LAB — CBC
HCT: 30.6 % — ABNORMAL LOW (ref 36.0–46.0)
Hemoglobin: 10.4 g/dL — ABNORMAL LOW (ref 12.0–15.0)
MCH: 29.5 pg (ref 26.0–34.0)
MCHC: 34 g/dL (ref 30.0–36.0)
MCV: 86.7 fL (ref 78.0–100.0)
PLATELETS: 154 10*3/uL (ref 150–400)
RBC: 3.53 MIL/uL — AB (ref 3.87–5.11)
RDW: 13.6 % (ref 11.5–15.5)
WBC: 10.3 10*3/uL (ref 4.0–10.5)

## 2014-05-15 LAB — BASIC METABOLIC PANEL
BUN: 7 mg/dL (ref 6–23)
CALCIUM: 8.4 mg/dL (ref 8.4–10.5)
CO2: 23 mEq/L (ref 19–32)
Chloride: 104 mEq/L (ref 96–112)
Creatinine, Ser: 0.52 mg/dL (ref 0.50–1.10)
GFR calc non Af Amer: >90 mL/min (ref >90)
Glucose, Bld: 58 mg/dL — ABNORMAL LOW (ref 70–99)
POTASSIUM: 3.8 meq/L (ref 3.7–5.3)
Sodium: 138 mEq/L (ref 137–147)

## 2014-05-15 LAB — TYPE AND SCREEN
ABO/RH(D): O POS
Antibody Screen: NEGATIVE

## 2014-05-15 LAB — RPR

## 2014-05-15 NOTE — Patient Instructions (Signed)
Your procedure is scheduled on: Wednesday, May 17, 2014  Enter through the [employer] [facility] at: 7:00AM  Pick up the phone at the desk and dial [phone].  Call this number if you have problems the morning of surgery: [phone].  Remember: Do NOT eat food: After midnight Tuesday  Do NOT drink clear liquids after: After midnight Tuesday  Take these medicines the morning of surgery with a SIP OF WATER: NONE *Please contact doctor who is managing your insulin pump for surgical management of pump  Do NOT wear jewelry (body piercing), metal hair clips/bobby pins, make-up, or nail polish. Do NOT wear lotions, powders, or perfumes.  You may wear deoderant. Do NOT shave for 48 hours prior to surgery. Do NOT bring valuables to the hospital. Leave suitcase in car.  After surgery it may be brought to your room.  For patients admitted to the hospital, checkout time is 11:00 AM the day of discharge.

## 2014-05-15 NOTE — Pre-Procedure Instructions (Signed)
Dr. Mal Amabile  made aware of patients low glucose level on BMP.  Dr. Malen Gauze instructed that the  patient should decrease her basal rate by 33%-50% at midnight before her surgery.  If patient has trouble with low glucose during the night before surgery patient is to take in glucose to increase her levels.  Pt called and left voice message with detailed instructions asked patient to return my call to verify she received the information or if she had additional questions.  Spoke with Gina diabetes coordinator earlier today to inform her of the patient and her date of surgery, she mentioned patient may need to be on the glucostabilizer protocol.    I have also faxed Dr. Adrian Prince for his input on the care of Ms. Molly [name] for her CSection.

## 2014-05-16 NOTE — Pre-Procedure Instructions (Signed)
Followed up from Tamika Hester's PAT appointment with patient on 05/15/14 regarding on how to manage insulin pump before, during and after surgery.  Dr Evlyn Kanner is out of the office until Friday, May 19, 2014.  Spoke to [employer], who will work with on call MD to get [place] this information today by phone or fax.  Amy, RN is aware that patient's surgery is tomorrow at 830 AM.  Will keep checking for fax.

## 2014-05-17 ENCOUNTER — Inpatient Hospital Stay (HOSPITAL_COMMUNITY): Payer: BC Managed Care – PPO | Admitting: Anesthesiology

## 2014-05-17 ENCOUNTER — Encounter (HOSPITAL_COMMUNITY): Payer: BC Managed Care – PPO | Admitting: Anesthesiology

## 2014-05-17 ENCOUNTER — Inpatient Hospital Stay (HOSPITAL_COMMUNITY)
Admission: RE | Admit: 2014-05-17 | Discharge: 2014-05-19 | DRG: 765 | Disposition: A | Discharge status: Home / Self Care | Payer: BC Managed Care – PPO | Source: Ambulatory Visit | Attending: Obstetrics & Gynecology | Admitting: Obstetrics & Gynecology

## 2014-05-17 ENCOUNTER — Encounter (HOSPITAL_COMMUNITY)
Admission: RE | Disposition: A | Discharge status: Home / Self Care | Payer: Self-pay | Source: Ambulatory Visit | Attending: Obstetrics & Gynecology

## 2014-05-17 ENCOUNTER — Encounter (HOSPITAL_COMMUNITY): Payer: Self-pay | Admitting: Anesthesiology

## 2014-05-17 DIAGNOSIS — Z98891 History of uterine scar from previous surgery: Secondary | ICD-10-CM

## 2014-05-17 DIAGNOSIS — E109 Type 1 diabetes mellitus without complications: Secondary | ICD-10-CM | POA: Diagnosis present

## 2014-05-17 DIAGNOSIS — Z794 Long term (current) use of insulin: Secondary | ICD-10-CM

## 2014-05-17 DIAGNOSIS — A6000 Herpesviral infection of urogenital system, unspecified: Secondary | ICD-10-CM | POA: Diagnosis present

## 2014-05-17 DIAGNOSIS — O2432 Unspecified pre-existing diabetes mellitus in childbirth: Secondary | ICD-10-CM | POA: Diagnosis present

## 2014-05-17 DIAGNOSIS — O9902 Anemia complicating childbirth: Secondary | ICD-10-CM | POA: Diagnosis present

## 2014-05-17 DIAGNOSIS — D649 Anemia, unspecified: Secondary | ICD-10-CM | POA: Diagnosis present

## 2014-05-17 DIAGNOSIS — O98519 Other viral diseases complicating pregnancy, unspecified trimester: Secondary | ICD-10-CM | POA: Diagnosis present

## 2014-05-17 DIAGNOSIS — H3500 Unspecified background retinopathy: Secondary | ICD-10-CM | POA: Diagnosis present

## 2014-05-17 DIAGNOSIS — O34219 Maternal care for unspecified type scar from previous cesarean delivery: Principal | ICD-10-CM | POA: Diagnosis present

## 2014-05-17 DIAGNOSIS — Z8249 Family history of ischemic heart disease and other diseases of the circulatory system: Secondary | ICD-10-CM

## 2014-05-17 LAB — GLUCOSE, CAPILLARY
GLUCOSE-CAPILLARY: 118 mg/dL — AB (ref 70–99)
GLUCOSE-CAPILLARY: 136 mg/dL — AB (ref 70–99)
GLUCOSE-CAPILLARY: 173 mg/dL — AB (ref 70–99)
Glucose-Capillary: 128 mg/dL — ABNORMAL HIGH (ref 70–99)
Glucose-Capillary: 97 mg/dL (ref 70–99)
Glucose-Capillary: 167 mg/dL — ABNORMAL HIGH (ref 70–99)

## 2014-05-17 SURGERY — Surgical Case
Anesthesia: Spinal | Site: Abdomen

## 2014-05-17 MED ORDER — FENTANYL CITRATE 0.05 MG/ML IJ SOLN: 25.0000 ug | INTRAMUSCULAR | Status: DC | PRN

## 2014-05-17 MED ORDER — DIPHENHYDRAMINE HCL 50 MG/ML IJ SOLN: 25.0000 mg | INTRAMUSCULAR | Status: DC | PRN

## 2014-05-17 MED ORDER — MORPHINE SULFATE 0.5 MG/ML IJ SOLN
INTRAMUSCULAR | Status: AC
  Filled 2014-05-17: qty 10

## 2014-05-17 MED ORDER — KETOROLAC TROMETHAMINE 30 MG/ML IJ SOLN
INTRAMUSCULAR | Status: AC
  Administered 2014-05-17: 30 mg via INTRAMUSCULAR
  Filled 2014-05-17: qty 1

## 2014-05-17 MED ORDER — KETOROLAC TROMETHAMINE 30 MG/ML IJ SOLN: 30.0000 mg | Freq: Four times a day (QID) | INTRAMUSCULAR | Status: AC | PRN

## 2014-05-17 MED ORDER — KETOROLAC TROMETHAMINE 30 MG/ML IJ SOLN
30.0000 mg | Freq: Four times a day (QID) | INTRAMUSCULAR | Status: AC | PRN
Start: 2014-05-17 — End: 2014-05-18
  Administered 2014-05-17: 30 mg via INTRAMUSCULAR

## 2014-05-17 MED ORDER — SENNOSIDES-DOCUSATE SODIUM 8.6-50 MG PO TABS
2.0000 | ORAL_TABLET | ORAL | Status: DC
  Administered 2014-05-17 – 2014-05-19 (×2): 2 via ORAL
  Filled 2014-05-17 (×2): qty 2

## 2014-05-17 MED ORDER — TETANUS-DIPHTH-ACELL PERTUSSIS 5-2.5-18.5 LF-MCG/0.5 IM SUSP
0.5000 mL | Freq: Once | INTRAMUSCULAR | Status: AC
  Administered 2014-05-18: 0.5 mL via INTRAMUSCULAR
  Filled 2014-05-17: qty 0.5

## 2014-05-17 MED ORDER — DIPHENHYDRAMINE HCL 25 MG PO CAPS: 25.0000 mg | ORAL_CAPSULE | Freq: Four times a day (QID) | ORAL | Status: DC | PRN

## 2014-05-17 MED ORDER — SIMETHICONE 80 MG PO CHEW: 80.0000 mg | CHEWABLE_TABLET | ORAL | Status: DC | PRN

## 2014-05-17 MED ORDER — FENTANYL CITRATE 0.05 MG/ML IJ SOLN
INTRAMUSCULAR | Status: DC | PRN
  Administered 2014-05-17: 25 ug via INTRATHECAL

## 2014-05-17 MED ORDER — SIMETHICONE 80 MG PO CHEW
80.0000 mg | CHEWABLE_TABLET | Freq: Three times a day (TID) | ORAL | Status: DC
  Administered 2014-05-18 – 2014-05-19 (×4): 80 mg via ORAL
  Filled 2014-05-17 (×6): qty 1

## 2014-05-17 MED ORDER — CITRIC ACID-SODIUM CITRATE 334-500 MG/5ML PO SOLN
30.0000 mL | Freq: Once | ORAL | Status: AC
  Administered 2014-05-17: 30 mL via ORAL

## 2014-05-17 MED ORDER — NALBUPHINE HCL 10 MG/ML IJ SOLN: 5.0000 mg | INTRAMUSCULAR | Status: DC | PRN

## 2014-05-17 MED ORDER — IBUPROFEN 600 MG PO TABS: 600.0000 mg | ORAL_TABLET | Freq: Four times a day (QID) | ORAL | Status: DC

## 2014-05-17 MED ORDER — CEFAZOLIN SODIUM-DEXTROSE 2-3 GM-% IV SOLR
2.0000 g | INTRAVENOUS | Status: AC
  Administered 2014-05-17: 2 g via INTRAVENOUS
  Filled 2014-05-17: qty 50

## 2014-05-17 MED ORDER — LACTATED RINGERS IV SOLN
INTRAVENOUS | Status: DC
  Administered 2014-05-17 – 2014-05-18 (×2): via INTRAVENOUS

## 2014-05-17 MED ORDER — INSULIN PUMP
Freq: Three times a day (TID) | SUBCUTANEOUS | Status: DC
  Administered 2014-05-17: 2.3 via SUBCUTANEOUS
  Administered 2014-05-17: 4.5 via SUBCUTANEOUS
  Administered 2014-05-18: 09:00:00 via SUBCUTANEOUS
  Administered 2014-05-18: 5 via SUBCUTANEOUS
  Filled 2014-05-17: qty 1

## 2014-05-17 MED ORDER — PHENYLEPHRINE 8 MG IN D5W 100 ML (0.08MG/ML) PREMIX OPTIME
INJECTION | INTRAVENOUS | Status: DC | PRN
  Administered 2014-05-17: 50 ug/min via INTRAVENOUS

## 2014-05-17 MED ORDER — NALOXONE HCL 0.4 MG/ML IJ SOLN: 0.4000 mg | INTRAMUSCULAR | Status: DC | PRN

## 2014-05-17 MED ORDER — MORPHINE SULFATE (PF) 0.5 MG/ML IJ SOLN
INTRAMUSCULAR | Status: DC | PRN
  Administered 2014-05-17: .15 mg via INTRATHECAL

## 2014-05-17 MED ORDER — OXYTOCIN 40 UNITS IN LACTATED RINGERS INFUSION - SIMPLE MED: 62.5000 mL/h | INTRAVENOUS | Status: AC

## 2014-05-17 MED ORDER — DIPHENHYDRAMINE HCL 25 MG PO CAPS: 25.0000 mg | ORAL_CAPSULE | ORAL | Status: DC | PRN

## 2014-05-17 MED ORDER — PRENATAL MULTIVITAMIN CH
1.0000 | ORAL_TABLET | Freq: Every day | ORAL | Status: DC
  Administered 2014-05-18: 1 via ORAL
  Filled 2014-05-17: qty 1

## 2014-05-17 MED ORDER — NALBUPHINE HCL 10 MG/ML IJ SOLN
5.0000 mg | INTRAMUSCULAR | Status: DC | PRN
  Administered 2014-05-17: 10 mg via INTRAVENOUS
  Filled 2014-05-17: qty 1

## 2014-05-17 MED ORDER — SODIUM CHLORIDE 0.9 % IJ SOLN: 3.0000 mL | INTRAMUSCULAR | Status: DC | PRN

## 2014-05-17 MED ORDER — FENTANYL CITRATE 0.05 MG/ML IJ SOLN
INTRAMUSCULAR | Status: AC
  Filled 2014-05-17: qty 2

## 2014-05-17 MED ORDER — SIMETHICONE 80 MG PO CHEW
80.0000 mg | CHEWABLE_TABLET | ORAL | Status: DC
  Administered 2014-05-17 – 2014-05-19 (×2): 80 mg via ORAL
  Filled 2014-05-17: qty 1

## 2014-05-17 MED ORDER — OXYTOCIN 10 UNIT/ML IJ SOLN
40.0000 [IU] | INTRAVENOUS | Status: DC | PRN
  Administered 2014-05-17: 40 [IU] via INTRAVENOUS

## 2014-05-17 MED ORDER — NALBUPHINE HCL 10 MG/ML IJ SOLN
INTRAMUSCULAR | Status: AC
  Filled 2014-05-17: qty 1

## 2014-05-17 MED ORDER — DIPHENHYDRAMINE HCL 50 MG/ML IJ SOLN
12.5000 mg | INTRAMUSCULAR | Status: DC | PRN
Start: 2014-05-17 — End: 2014-05-19

## 2014-05-17 MED ORDER — SCOPOLAMINE 1 MG/3DAYS TD PT72
MEDICATED_PATCH | TRANSDERMAL | Status: AC
  Administered 2014-05-17: 1.5 mg via TRANSDERMAL
  Filled 2014-05-17: qty 1

## 2014-05-17 MED ORDER — ZOLPIDEM TARTRATE 5 MG PO TABS: 5.0000 mg | ORAL_TABLET | Freq: Every evening | ORAL | Status: DC | PRN

## 2014-05-17 MED ORDER — ONDANSETRON HCL 4 MG/2ML IJ SOLN
INTRAMUSCULAR | Status: DC | PRN
  Administered 2014-05-17: 4 mg via INTRAVENOUS

## 2014-05-17 MED ORDER — ONDANSETRON HCL 4 MG PO TABS: 4.0000 mg | ORAL_TABLET | ORAL | Status: DC | PRN

## 2014-05-17 MED ORDER — 0.9 % SODIUM CHLORIDE (POUR BTL) OPTIME
TOPICAL | Status: DC | PRN
  Administered 2014-05-17: 1000 mL

## 2014-05-17 MED ORDER — WITCH HAZEL-GLYCERIN EX PADS: 1.0000 "application " | MEDICATED_PAD | CUTANEOUS | Status: DC | PRN

## 2014-05-17 MED ORDER — SCOPOLAMINE 1 MG/3DAYS TD PT72
1.0000 | MEDICATED_PATCH | Freq: Once | TRANSDERMAL | Status: DC
  Administered 2014-05-17: 1.5 mg via TRANSDERMAL

## 2014-05-17 MED ORDER — LACTATED RINGERS IV SOLN
INTRAVENOUS | Status: DC
  Administered 2014-05-17 (×3): via INTRAVENOUS

## 2014-05-17 MED ORDER — MENTHOL 3 MG MT LOZG: 1.0000 | LOZENGE | OROMUCOSAL | Status: DC | PRN

## 2014-05-17 MED ORDER — OXYCODONE-ACETAMINOPHEN 5-325 MG PO TABS
1.0000 | ORAL_TABLET | ORAL | Status: DC | PRN
  Administered 2014-05-18: 1 via ORAL
  Administered 2014-05-19 (×2): 2 via ORAL
  Filled 2014-05-17 (×2): qty 2
  Filled 2014-05-17: qty 1

## 2014-05-17 MED ORDER — BUPIVACAINE IN DEXTROSE 0.75-8.25 % IT SOLN
INTRATHECAL | Status: DC | PRN
  Administered 2014-05-17: 1.6 mL via INTRATHECAL

## 2014-05-17 MED ORDER — IBUPROFEN 600 MG PO TABS
600.0000 mg | ORAL_TABLET | Freq: Four times a day (QID) | ORAL | Status: DC
  Administered 2014-05-17 – 2014-05-19 (×7): 600 mg via ORAL
  Filled 2014-05-17 (×7): qty 1

## 2014-05-17 MED ORDER — NALOXONE HCL 1 MG/ML IJ SOLN: 1.0000 ug/kg/h | INTRAMUSCULAR | Status: DC | PRN

## 2014-05-17 MED ORDER — CITRIC ACID-SODIUM CITRATE 334-500 MG/5ML PO SOLN
ORAL | Status: AC
  Filled 2014-05-17: qty 15

## 2014-05-17 MED ORDER — OXYTOCIN 10 UNIT/ML IJ SOLN
INTRAMUSCULAR | Status: AC
  Filled 2014-05-17: qty 4

## 2014-05-17 MED ORDER — METOCLOPRAMIDE HCL 5 MG/ML IJ SOLN: 10.0000 mg | Freq: Three times a day (TID) | INTRAMUSCULAR | Status: DC | PRN

## 2014-05-17 MED ORDER — ONDANSETRON HCL 4 MG/2ML IJ SOLN: 4.0000 mg | Freq: Three times a day (TID) | INTRAMUSCULAR | Status: DC | PRN

## 2014-05-17 MED ORDER — MEPERIDINE HCL 25 MG/ML IJ SOLN: 6.2500 mg | INTRAMUSCULAR | Status: DC | PRN

## 2014-05-17 MED ORDER — LANOLIN HYDROUS EX OINT: 1.0000 "application " | TOPICAL_OINTMENT | CUTANEOUS | Status: DC | PRN

## 2014-05-17 MED ORDER — ONDANSETRON HCL 4 MG/2ML IJ SOLN
INTRAMUSCULAR | Status: AC
  Filled 2014-05-17: qty 2

## 2014-05-17 MED ORDER — ONDANSETRON HCL 4 MG/2ML IJ SOLN
4.0000 mg | INTRAMUSCULAR | Status: DC | PRN
Start: 2014-05-17 — End: 2014-05-19

## 2014-05-17 MED ORDER — DIBUCAINE 1 % RE OINT: 1.0000 "application " | TOPICAL_OINTMENT | RECTAL | Status: DC | PRN

## 2014-05-17 SURGICAL SUPPLY — 36 items
BENZOIN TINCTURE PRP APPL 2/3 (GAUZE/BANDAGES/DRESSINGS) ×3 IMPLANT
CLAMP CORD UMBIL (MISCELLANEOUS) ×3 IMPLANT
CLOSURE WOUND 1/2 X4 (GAUZE/BANDAGES/DRESSINGS) ×1
CLOTH BEACON ORANGE TIMEOUT ST (SAFETY) ×3 IMPLANT
DRAPE LG THREE QUARTER DISP (DRAPES) ×3 IMPLANT
DRSG OPSITE POSTOP 4X10 (GAUZE/BANDAGES/DRESSINGS) ×3 IMPLANT
DURAPREP 26ML APPLICATOR (WOUND CARE) ×3 IMPLANT
ELECT REM PT RETURN 9FT ADLT (ELECTROSURGICAL) ×3
ELECTRODE REM PT RTRN 9FT ADLT (ELECTROSURGICAL) ×1 IMPLANT
EXTRACTOR VACUUM BELL STYLE (SUCTIONS) IMPLANT
GLOVE BIO SURGEON STRL SZ 6.5 (GLOVE) ×2 IMPLANT
GLOVE BIO SURGEONS STRL SZ 6.5 (GLOVE) ×1
GLOVE BIOGEL PI IND STRL 7.0 (GLOVE) ×1 IMPLANT
GLOVE BIOGEL PI INDICATOR 7.0 (GLOVE) ×2
GOWN STRL REUS W/TWL LRG LVL3 (GOWN DISPOSABLE) ×9 IMPLANT
KIT ABG SYR 3ML LUER SLIP (SYRINGE) IMPLANT
NEEDLE HYPO 25X5/8 SAFETYGLIDE (NEEDLE) IMPLANT
NS IRRIG 1000ML POUR BTL (IV SOLUTION) ×3 IMPLANT
PACK C SECTION WH (CUSTOM PROCEDURE TRAY) ×3 IMPLANT
PAD OB MATERNITY 4.3X12.25 (PERSONAL CARE ITEMS) ×3 IMPLANT
RETRACTOR WND ALEXIS 25 LRG (MISCELLANEOUS) ×1 IMPLANT
RTRCTR WOUND ALEXIS 25CM LRG (MISCELLANEOUS) ×3
STAPLER VISISTAT 35W (STAPLE) IMPLANT
STRIP CLOSURE SKIN 1/2X4 (GAUZE/BANDAGES/DRESSINGS) ×2 IMPLANT
SUT CHROMIC 2 0 CT 1 (SUTURE) ×3 IMPLANT
SUT MNCRL 0 VIOLET CTX 36 (SUTURE) ×2 IMPLANT
SUT MONOCRYL 0 CTX 36 (SUTURE) ×4
SUT PDS AB 0 CTX 36 PDP370T (SUTURE) ×3 IMPLANT
SUT PLAIN 2 0 (SUTURE) ×2
SUT PLAIN ABS 2-0 CT1 27XMFL (SUTURE) ×1 IMPLANT
SUT VIC AB 0 CT1 27 (SUTURE)
SUT VIC AB 0 CT1 27XBRD ANBCTR (SUTURE) IMPLANT
SUT VIC AB 4-0 KS 27 (SUTURE) ×3 IMPLANT
TOWEL OR 17X24 6PK STRL BLUE (TOWEL DISPOSABLE) ×3 IMPLANT
TRAY FOLEY CATH 14FR (SET/KITS/TRAYS/PACK) ×3 IMPLANT
WATER STERILE IRR 1000ML POUR (IV SOLUTION) IMPLANT

## 2014-05-17 NOTE — Addendum Note (Signed)
Addendum created 05/17/14 1108 by Graciela Husbands, CRNA   Modules edited: Anesthesia Responsible Staff

## 2014-05-17 NOTE — Transfer of Care (Signed)
Immediate Anesthesia Transfer of Care Note  Patient: Molly [name]  Procedure(s) Performed: Procedure(s) with comments: CESAREAN SECTION (N/A) - repeat  Patient Location: PACU  Anesthesia Type:Spinal  Level of Consciousness: awake, alert , oriented and patient cooperative  Airway & Oxygen Therapy: Patient Spontanous Breathing  Post-op Assessment: Report given to PACU RN and Post -op Vital signs reviewed and stable  Post vital signs: Reviewed and stable  Complications: No apparent anesthesia complications

## 2014-05-17 NOTE — Progress Notes (Signed)
Inpatient Diabetes Program Recommendations  AACE/ADA: New Consensus Statement on Inpatient Glycemic Control (2013)  Target Ranges:  Prepandial:   less than 140 mg/dL      Peak postprandial:   less than 180 mg/dL (1-2 hours)      Critically ill patients:  140 - 180 mg/dL   This coordinator spoke with patient concerning insulin pump management post delivery.  Patient is awake, alert, oriented and able to manage her pump.  Pt reports having her supplies at bedside (infusion set and reservoir).  Pt reports having her pump in SUSPEND mode currently but is watching her continuous glucose monitor for trends.  Once her CBGs begin to rise she will restart her pump infusion.  Pt will request CBG check from RN/NT at this time.  No questions/concerns at this time.   Will follow. Thank you  Piedad Climes BSN, RN,CDE Inpatient Diabetes Coordinator [phone] (team pager)

## 2014-05-17 NOTE — Op Note (Addendum)
Cesarean Section Procedure Note  Indications: previous uterine incision kerr x1 and patient with type I diabetes  Pre-operative Diagnosis: 71 week SIUP with prior low transverse cesarean delivery Type I diabetes  Post-operative Diagnosis: same  Surgeon: Caffie Damme   Assistants: Claris Gower  Anesthesia: Spinal anesthesia  ASA Class: 2  Procedure Details   The patient was seen in the Holding Room. The risks, benefits, complications, treatment options, and expected outcomes were discussed with the patient.  The patient concurred with the proposed plan, giving informed consent.  The site of surgery properly noted/marked. The patient was taken to Operating Room # 9, identified as Molly, [name] and the procedure verified as C-Section Delivery. A Time Out was held and the above information confirmed.  After induction of anesthesia, the patient was placed in the dorsal supine position with a leftward tilt, draped and prepped in the usual sterile manner. A Pfannenstiel incision was made and carried down through the subcutaneous tissue to the fascia.  The fascia was incised in the midline and the fascial incision was extended laterally with Mayo scissors. The superior aspect of the fascial incision was grasped with Coker clamps x2, tented up and the rectus muscles dissected off sharply with the scalpel. The rectus was then dissected off with blunt dissection and Mayo scissors inferiorly. The rectus muscles were separated in the midline. The abdominal peritoneum was identified, tented up, entered sharply, and the incision was extended superiorly and inferiorly with good visualization of the bladder. The Alexis retractor was then deployed. The vesicouterine peritoneum was identified, tented up, entered sharply with Metzenbaum scissors, and the bladder flap was created digitally. Scalpel was then used to make a low transverse incision on the uterus which was extended laterally with  blunt  dissection. The fetal vertex was identified, delivered easily through the uterine incision followed by the body. The A live female infant was bulb suctioned on the operative field cried vigorously, cord was clamped and cut and the infant was passed to the waiting neonatologist. Apgars 8/9. Placenta was then delivered spontaneously, intact and appear normal, the uterus was cleared of all clot and debris. The uterine incision was repaired with #0 Monocryl in running locked fashion. A second imbricating suture was performed. Hemostasis was excellent Ovaries and tubes were inspected and normal. Irrigation with sterile warm normal saline was used. The Alexis retractor was removed. The abdominal peritoneum was reapproximated with 2-0 chromic in a running fashion, the rectus muscles was reapproximated with #2 chromic in interrupted fashion. The fascia was closed with 1 PDS in a running fashion from both ends of fascia,  Met in the middle. The subcutaneous layer was re-approximated with interrupted suture of 2-0 plain. The skin was closed with 4-0 vicryl in a subcuticular fashion and dressed with benzoine, steri strips and pressure dressing. All sponge lap and needle counts were correct x2. Patient tolerated the procedure well and recovered in stable condition following the procedure.  Instrument, sponge, and needle counts were correct prior the abdominal closure and at the conclusion of the case.   Findings: Live female infant, Apgars 8/9, clear fluid normal appearing placenta, normal uterus, bilateral tubes and ovaries  Estimated Blood Loss:  827mL         Drains: Foley catheter 300 mL of clear urine at end of procedure                 Specimens: none         Implants: none  Complications:  None; patient tolerated the procedure well.         Disposition: PACU - hemodynamically stable.  Molly [name] Molly [name]

## 2014-05-17 NOTE — H&P (Signed)
Molly [name] is a 28 y.o. female presenting for scheduled repeat cesarean delivery. Maternal Medical History:  Reason for admission: Patient admitted for scheduled repeat cesarean delivery at term.   Fetal activity: Perceived fetal activity is normal.   Last perceived fetal movement was within the past 12 hours.    Prenatal Complications - Diabetes: type 1. Diabetes is managed by insulin pump.      OB History   Grav Para Term Preterm Abortions TAB SAB Ect Mult Living   2 1 1       1      Past Medical History  Diagnosis Date  . Abnormal Pap smear of cervix   . Herpes   . Diabetes mellitus     type 1  . 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 Lymph node biopsy    . Cesarean section     Family History: family history includes Hyperlipidemia in her father; Hypertension in her father. Social History:  reports that she has never smoked. She has never used smokeless tobacco. She reports that she drinks about 1.2 ounces of alcohol per week. She reports that she does not use illicit drugs.   Prenatal Transfer Tool  Maternal Diabetes: Yes:  Diabetes Type:  Pre-pregnancy Genetic Screening: Normal Maternal Ultrasounds/Referrals: Normal Fetal Ultrasounds or other Referrals:  None Maternal Substance Abuse:  No Significant Maternal Medications:  Meds include: Other: insulin Significant Maternal Lab Results:  None Other Comments:  None  Review of Systems  Constitutional: Negative for fever.  Eyes: Negative for blurred vision.  Respiratory: Negative for cough.   Cardiovascular: Negative for chest pain.  Gastrointestinal: Negative for heartburn.  Genitourinary: Negative for dysuria.  Musculoskeletal: Negative for myalgias.  Skin: Negative for rash.  Neurological: Negative for dizziness and headaches.  Endo/Heme/Allergies: Does not bruise/bleed easily.  Psychiatric/Behavioral: Negative  for depression.  All other systems reviewed and are negative.     Blood pressure 123/84, pulse 118, temperature 98.2 F (36.8 C), temperature source Oral, resp. rate 18, last menstrual period 08/17/2013, SpO2 100.00%. Maternal Exam:  Uterine Assessment: No contractions  Abdomen: Surgical scars: low transverse.   Fundal height is 39 cm.   Estimated fetal weight is 3500 grams.   Fetal presentation: vertex  Introitus: Normal vulva. Normal vagina.  Ferning test: not done.  Nitrazine test: not done. Amniotic fluid character: not assessed.     Physical Exam  Constitutional: She is oriented to person, place, and time. She appears well-developed and well-nourished.  HENT:  Head: Normocephalic.  Eyes: Pupils are equal, round, and reactive to light.  Neck: Normal range of motion.  Cardiovascular: Normal rate and regular rhythm.   Respiratory: Effort normal and breath sounds normal.  GI: Soft.  Neurological: She is alert and oriented to person, place, and time. She has normal reflexes.    Prenatal labs: ABO, Rh: --/--/O POS (06/08 1125) Antibody: NEG (06/08 1125) Rubella: Immune (12/03 0000) RPR: NON REAC (06/08 1125)  HBsAg: Negative (12/03 0000)  HIV:    GBS:     Assessment/Plan: SIUP at term with Class B (type I diabetes) diabetic Prior Cesarean delivery  To OR for repeat CD at term   Wynonia Hazard 05/17/2014, 8:19 AM

## 2014-05-17 NOTE — Anesthesia Postprocedure Evaluation (Signed)
  Anesthesia Post-op Note  Patient: Molly [name]  Procedure(s) Performed: Procedure(s) with comments: CESAREAN SECTION (N/A) - repeat  Patient Location: PACU  Anesthesia Type:Spinal  Level of Consciousness: awake, alert  and oriented  Airway and Oxygen Therapy: Patient Spontanous Breathing  Post-op Pain: none  Post-op Assessment: Post-op Vital signs reviewed, Patient's Cardiovascular Status Stable, Respiratory Function Stable, Patent Airway, No signs of Nausea or vomiting, Pain level controlled, No headache and No backache  Post-op Vital Signs: Reviewed and stable  Last Vitals:  Filed Vitals:   05/17/14 1045  BP:   Pulse: 84  Temp:   Resp: 24    Complications: No apparent anesthesia complications

## 2014-05-17 NOTE — Brief Op Note (Signed)
05/17/2014  9:47 AM  PATIENT:  Molly [name]  28 y.o. female  PRE-OPERATIVE DIAGNOSIS:  Previous Cesarean Section  POST-OPERATIVE DIAGNOSIS:  Previous Cesarean Section  PROCEDURE:  Procedure(s) with comments: CESAREAN SECTION (N/A) - repeat  SURGEON:  Surgeon(s) and Role:    * Essie Hart, MD - Primary    * W Lodema Hong, MD - Assisting    ASSISTANTS: Marijean Heath   ANESTHESIA:   spinal  EBL:  Total I/O In: 3000 [I.V.:3000] Out: 1100 [Urine:300; Blood:800]  BLOOD ADMINISTERED:none  DRAINS: Urinary Catheter (Foley)   LOCAL MEDICATIONS USED:  NONE  SPECIMEN:  No Specimen  DISPOSITION OF SPECIMEN:  N/A  COUNTS:  YES  TOURNIQUET:  * No tourniquets in log *  DICTATION: .Note written in EPIC  PLAN OF CARE: Admit to inpatient   PATIENT DISPOSITION:  PACU - hemodynamically stable.   Delay start of Pharmacological VTE agent (>24hrs) due to surgical blood loss or risk of bleeding: not applicable  Coltrane Tugwell [name]

## 2014-05-17 NOTE — Progress Notes (Signed)
CBG 174 Data not transferring from glucometer.

## 2014-05-17 NOTE — Anesthesia Preprocedure Evaluation (Signed)
Anesthesia Evaluation  Patient identified by MRN, date of birth, ID band Patient awake    Reviewed: Allergy & Precautions, H&P , NPO status , Patient's Chart, lab work & pertinent test results  Airway Mallampati: III TM Distance: >3 FB Neck ROM: Full    Dental no notable dental hx. (+) Teeth Intact   Pulmonary neg pulmonary ROS,  breath sounds clear to auscultation  Pulmonary exam normal       Cardiovascular negative cardio ROS  Rhythm:Regular Rate:Normal     Neuro/Psych Proliferative retinopathy negative psych ROS   GI/Hepatic Neg liver ROS, GERD-  Medicated and Controlled,  Endo/Other  diabetes, Well Controlled, Type 1  Renal/GU negative Renal ROS  negative genitourinary   Musculoskeletal negative musculoskeletal ROS (+)   Abdominal   Peds  Hematology  (+) anemia ,   Anesthesia Other Findings   Reproductive/Obstetrics HSV Previous C/Section Abnormal Pap smear                           Anesthesia Physical Anesthesia Plan  ASA: II  Anesthesia Plan: Spinal   Post-op Pain Management:    Induction:   Airway Management Planned: Natural Airway  Additional Equipment:   Intra-op Plan:   Post-operative Plan:   Informed Consent: I have reviewed the patients History and Physical, chart, labs and discussed the procedure including the risks, benefits and alternatives for the proposed anesthesia with the patient or authorized representative who has indicated his/her understanding and acceptance.     Plan Discussed with: Anesthesiologist, Surgeon and CRNA  Anesthesia Plan Comments:         Anesthesia Quick Evaluation

## 2014-05-17 NOTE — Anesthesia Procedure Notes (Signed)
Spinal  Patient location during procedure: OR Start time: 05/17/2014 8:38 AM Staffing Anesthesiologist: Darnette Lampron A. Performed by: anesthesiologist  Preanesthetic Checklist Completed: patient identified, site marked, surgical consent, pre-op evaluation, timeout performed, IV checked, risks and benefits discussed and monitors and equipment checked Spinal Block Patient position: sitting Prep: site prepped and draped and DuraPrep Patient monitoring: heart rate, cardiac monitor, continuous pulse ox and blood pressure Approach: midline Location: L3-4 Injection technique: single-shot Needle Needle type: Sprotte  Needle gauge: 24 G Needle length: 9 cm Needle insertion depth: 5 cm Assessment Sensory level: T4 Additional Notes Patient tolerated procedure well. Adequate sensory level.

## 2014-05-17 NOTE — Progress Notes (Signed)
Patient has no HIV result in prenatal record or lab results

## 2014-05-18 ENCOUNTER — Encounter (HOSPITAL_COMMUNITY): Payer: Self-pay | Admitting: Obstetrics & Gynecology

## 2014-05-18 LAB — CBC
HCT: 24.2 % — ABNORMAL LOW (ref 36.0–46.0)
Hemoglobin: 8.2 g/dL — ABNORMAL LOW (ref 12.0–15.0)
MCH: 29.5 pg (ref 26.0–34.0)
MCHC: 33.9 g/dL (ref 30.0–36.0)
MCV: 87.1 fL (ref 78.0–100.0)
PLATELETS: 175 10*3/uL (ref 150–400)
RBC: 2.78 MIL/uL — AB (ref 3.87–5.11)
RDW: 13.6 % (ref 11.5–15.5)
WBC: 8.9 10*3/uL (ref 4.0–10.5)

## 2014-05-18 LAB — GLUCOSE, CAPILLARY
GLUCOSE-CAPILLARY: 111 mg/dL — AB (ref 70–99)
GLUCOSE-CAPILLARY: 58 mg/dL — AB (ref 70–99)
GLUCOSE-CAPILLARY: 134 mg/dL — AB (ref 70–99)
Glucose-Capillary: 98 mg/dL (ref 70–99)
Glucose-Capillary: 100 mg/dL — ABNORMAL HIGH (ref 70–99)

## 2014-05-18 LAB — CCBB MATERNAL DONOR DRAW

## 2014-05-18 NOTE — Anesthesia Postprocedure Evaluation (Signed)
  Anesthesia Post-op Note  Patient: Molly [name]  Procedure(s) Performed: Procedure(s) with comments: CESAREAN SECTION (N/A) - repeat  Patient Location: Mother/Baby  Anesthesia Type:Spinal  Level of Consciousness: awake, alert , oriented and patient cooperative  Airway and Oxygen Therapy: Patient Spontanous Breathing  Post-op Pain: mild  Post-op Assessment: Patient's Cardiovascular Status Stable, Respiratory Function Stable, No headache, No backache, No residual numbness and No residual motor weakness  Post-op Vital Signs: stable  Last Vitals:  Filed Vitals:   05/18/14 0625  BP: 90/53  Pulse: 91  Temp: 36.6 C  Resp: 18    Complications: No apparent anesthesia complications

## 2014-05-18 NOTE — Addendum Note (Signed)
Addendum created 05/18/14 0954 by Earmon Phoenix, CRNA   Modules edited: Notes Section   Notes Section:  File: [id number]

## 2014-05-18 NOTE — Progress Notes (Signed)
!!!!!   HIV IS in prenatal records and is NEG !!!!!  Patient is eating, ambulating, voiding.  Pain control is good.  Filed Vitals:   05/17/14 1922 05/17/14 2111 05/18/14 0130 05/18/14 0625  BP: 105/65 94/51 103/63 90/53  Pulse: 69 82 82 91  Temp: 98 F (36.7 C) 98.2 F (36.8 C) 97.9 F (36.6 C) 97.8 F (36.6 C)  TempSrc: Oral Oral Oral Oral  Resp: 18 18 18 18   Height:      Weight:      SpO2: 100% 98% 99% 97%    lungs:   clear to auscultation cor:    RRR Abdomen:  soft, appropriate tenderness, incisions intact and without erythema or exudate ex:    no cords   Lab Results  Component Value Date   WBC 8.9 05/18/2014   HGB 8.2* 05/18/2014   HCT 24.2* 05/18/2014   MCV 87.1 05/18/2014   PLT 175 05/18/2014    --/--/O POS (06/08 1125)/RI  A/P    Post operative day 1.  Routine post op and postpartum care.  Expect d/c tomorrow.  Percocet for pain control.

## 2014-05-18 NOTE — Progress Notes (Signed)
Hypoglycemic Event  CBG: 58   Treatment: 15 GM carbohydrate snack and breakfast  Symptoms: None  Follow-up CBG: Time: 0855  CBG Result:  134  Possible Reasons for Event: Inadequate meal intake  Comments/MD notified: MD rounded on patient while pt was eating breakfast.  Notified of low CBG.    Martisha Toulouse, Clide Dales  Remember to initiate Hypoglycemia Order Set & complete

## 2014-05-18 NOTE — Progress Notes (Signed)
Patient gave herself insulin bolus 2u @ 3pm  and 4 units @ 1pm

## 2014-05-18 NOTE — Lactation Note (Signed)
This note was copied from the chart of Girl Darriona Connelly. Lactation Consultation Note  Initial consult:  Mother has blister or small positional stripe on left nipple.  Mother states it is improving. Baby recently breastfed for 30 min.  Elease Hashimoto RN viewed latch. Reviewed basics and chin tug to help open wider. Provided mother with comfort gels and using ebm. Mom encouraged to feed baby 8-12 times/24 hours and with feeding cues.  Mom made aware of O/P services, breastfeeding support groups, community resources, and our phone # for post-discharge questions.    Patient Name: Girl Makaria Demain [id number]'S Date: 05/18/2014 Reason for consult: Initial assessment   Maternal Data Infant to breast within first hour of birth: Yes Has patient been taught Hand Expression?: Yes Does the patient have breastfeeding experience prior to this delivery?: Yes  Feeding Length of feed: 11 min  LATCH Score/Interventions                      Lactation Tools Discussed/Used     Consult Status Consult Status: Follow-up Date: 05/19/14 Follow-up type: In-patient    Dahlia Byes [facility] 05/18/2014, 11:35 AM

## 2014-05-19 LAB — GLUCOSE, CAPILLARY
GLUCOSE-CAPILLARY: 59 mg/dL — AB (ref 70–99)
Glucose-Capillary: 179 mg/dL — ABNORMAL HIGH (ref 70–99)
Glucose-Capillary: 72 mg/dL (ref 70–99)

## 2014-05-19 MED ORDER — OXYCODONE-ACETAMINOPHEN 5-325 MG PO TABS: 1.0000 | ORAL_TABLET | ORAL | Status: AC | PRN

## 2014-05-20 NOTE — Progress Notes (Signed)
POD#2 Pt is doing well. She would like to go home early. VSSAF IMP/ doing well PLAN/ Will discharge to home.

## 2014-05-20 NOTE — Discharge Summary (Signed)
Obstetric Discharge Summary Reason for Admission: cesarean section Prenatal Procedures: NST and ultrasound Intrapartum Procedures: cesarean: low cervical, transverse Postpartum Procedures: none Complications-Operative and Postpartum: none Hemoglobin  Date Value Ref Range Status  05/18/2014 8.2* 12.0 - 15.0 g/dL Final     HCT  Date Value Ref Range Status  05/18/2014 24.2* 36.0 - 46.0 % Final    Physical Exam:  General: alert Lochia: appropriate Uterine Fundus: firm Incision: healing well DVT Evaluation: No evidence of DVT seen on physical exam.  Discharge Diagnoses: Term Pregnancy-delivered and DM and history of prior C/section  Discharge Information: Date: 05/20/2014 Activity: pelvic rest Diet: routine Medications: PNV and Percocet Condition: stable Instructions: refer to practice specific booklet Discharge to: home   Newborn Data: Live born female  Birth Weight: 9 lb 2.9 oz (4165 g) APGAR: 8, 9  Home with mother.  ANDERSON,MARK E 05/20/2014, 1:58 AM

## 2014-10-09 ENCOUNTER — Encounter (HOSPITAL_COMMUNITY): Payer: Self-pay | Admitting: Obstetrics & Gynecology

## 2016-11-17 ENCOUNTER — Other Ambulatory Visit: Payer: Self-pay | Admitting: Dermatology

## 2017-03-05 ENCOUNTER — Other Ambulatory Visit: Payer: Self-pay | Admitting: Obstetrics & Gynecology

## 2017-03-09 LAB — CYTOLOGY - PAP

## 2023-05-06 ENCOUNTER — Encounter (HOSPITAL_BASED_OUTPATIENT_CLINIC_OR_DEPARTMENT_OTHER): Payer: Self-pay

## 2023-05-06 ENCOUNTER — Ambulatory Visit (HOSPITAL_BASED_OUTPATIENT_CLINIC_OR_DEPARTMENT_OTHER): Admit: 2023-05-06 | Payer: BC Managed Care – PPO | Admitting: Obstetrics and Gynecology

## 2023-05-06 SURGERY — DILATATION AND CURETTAGE /HYSTEROSCOPY
Anesthesia: General
